# Patient Record
Sex: Male | Born: 1975 | Hispanic: Yes | Marital: Married | Smoking: Never smoker
Health system: Southern US, Community
[De-identification: ages and names within clinical notes are randomized; demographics above are authoritative.]

## PROBLEM LIST (undated history)

## (undated) DIAGNOSIS — E785 Hyperlipidemia, unspecified: Secondary | ICD-10-CM

## (undated) NOTE — ED Triage Notes (Signed)
Formatting of this note might be different from the original.  Pt reports he was drinking last night when he tripped and fell landing on L arm. Significant bruising and swelling noted.   Electronically signed by Wonda Amis, RN at 06/02/2018 11:26 PM EDT

## (undated) NOTE — ED Provider Notes (Signed)
Formatting of this note is different from the original.  26 year old received at sign out from [place] Downing pending [place] and MRI. Per her history:     "74 year old male, visiting from out of town, presents to the emergency department for left arm pain.  Patient was drinking with his uncle last night when he tripped and fell on the stairs.  Patient landed on his left arm.  He has had gradual worsening swelling to the left upper extremity.  This is accompanied by bruising.  He has not taken any medications for symptoms.  The patient was scheduled to return home today, but decided to come to the emergency department but given that his pain was so severe.  Family denies chronic alcohol use.  Pain is aggravated with movement of the left arm as well as palpation.  The patient is not on chronic anticoagulation."    Physical Exam   BP (!) 149/99 (BP Location: Right Leg)   Pulse 63   Temp 99.3 F (37.4 C) (Oral)   Resp 18   SpO2 94%     Physical Exam    NAD.  Ecchymosis noted to the left elbow.    ED Course/Procedures       Procedures    MDM   50 year old male received signout from PA whom is pending ultrasound and MRI for left arm pain after the patient had a fall last night.  Pain controlled in ED.  Ultrasound is negative for DVT of the left upper extremity.  MRI with extensive soft tissue injuries.  the radial and ulnar collateral ligaments are completely torn as well as the common flexor and extensor tendons.  There also associated extensive muscle injuries, including the pronator teres muscle.  Spoke with Dr. Dion Saucier about the MRI results.  No urgent surgical intervention required.  Posterior long-arm splint applied.  He was given a copy of his MRI results.  He is from [place] and plans to follow-up with orthopedics when he returns home.  Discharged with Rx for pain medication. A 85-month prescription history query was performed using the [place] CSRS prior to discharge.  Strict return precautions given, signs and  symptoms of compartment syndrome.  The patient is hemodynamically stable and in no acute distress.  He is safe for discharge home at this time with outpatient follow-up.      Barkley Boards, PA-C  06/03/18 1657      Gilda Crease, MD  06/04/18 [phone]    Electronically signed by Gilda Crease, MD at 06/04/2018  5:58 AM EDT    Associated attestation - Gilda Crease, MD - 06/04/2018  5:58 AM EDT  Formatting of this note might be different from the original.  Attestation: Medical screening examination/treatment/procedure(s) were performed by non-physician practitioner and as supervising physician I was immediately available for consultation/collaboration.    EKG: None

## (undated) NOTE — ED Provider Notes (Signed)
Formatting of this note is different from the original.  Images from the original note were not included.    [name]  Provider Note    CSN: [id number]  Arrival date & time: 06/02/18  2315    History    Chief Complaint  Chief Complaint   Patient presents with   ? Arm Injury     HPI  [name] is a 54 y.o. male.    33 year old male, visiting from out of town, presents to the emergency department for left arm pain.  Patient was drinking with his uncle last night when he tripped and fell on the stairs.  Patient landed on his left arm.  He has had gradual worsening swelling to the left upper extremity.  This is accompanied by bruising.  He has not taken any medications for symptoms.  The patient was scheduled to return home today, but decided to come to the emergency department but given that his pain was so severe.  Family denies chronic alcohol use.  Pain is aggravated with movement of the left arm as well as palpation.  The patient is not on chronic anticoagulation.    History reviewed. No pertinent past medical history.    There are no active problems to display for this patient.    History reviewed. No pertinent surgical history.     Home Medications      Prior to Admission medications    Not on File     Family History  No family history on file.    Social History  Social History     Tobacco Use   ? Smoking status: Not on file   Substance Use Topics   ? Alcohol use: Not on file   ? Drug use: Not on file     Allergies    Patient has no known allergies.    Review of Systems  Review of Systems  Ten systems reviewed and are negative for acute change, except as noted in the HPI.     Physical Exam  Updated Vital Signs  BP 131/90 (BP Location: Right Arm)   Pulse 99   Temp 99.3 F (37.4 C) (Oral)   Resp 18   SpO2 98%     Physical Exam   Constitutional: He is oriented to person, place, and time. He appears well-developed and well-nourished. No distress.   Appears  uncomfortable. Nontoxic.    HENT:   Head: Normocephalic and atraumatic.   Eyes: Conjunctivae and EOM are normal. No scleral icterus.   Neck: Normal range of motion.   Cardiovascular: Normal rate, regular rhythm and intact distal pulses.   Distal radial pulse 2+ in the LUE. Capillary refill brisk in all digits of the L hand.   Pulmonary/Chest: Effort normal. No respiratory distress.   Respirations even and unlabored   Musculoskeletal: Normal range of motion.   Extensive ecchymosis and edema to the medial aspect of the LUE extending from just below the L elbow proximally. Compartments remain soft despite degree of edema. Normal active PROM of the L wrist. Limited flexion and extension of the L elbow 2/2 pain. No bony deformity or crepitus.   Neurological: He is alert and oriented to person, place, and time. He exhibits normal muscle tone. Coordination normal.   Grip strength 4+/5 in the L hand 2/2 discomfort.   Skin: Skin is warm and dry. No rash noted. He is not diaphoretic. No erythema. No pallor.   Psychiatric: He has  a normal mood and affect. His behavior is normal.   Nursing note and vitals reviewed.    ED Treatments / Results   Labs  (all labs ordered are listed, but only abnormal results are displayed)  Labs Reviewed   BASIC METABOLIC PANEL - Abnormal; Notable for the following components:       Result Value    Glucose, Bld 149 (*)     Calcium 8.4 (*)     All other components within normal limits   D-DIMER, QUANTITATIVE (NOT AT [facility]) - Abnormal; Notable for the following components:    D-Dimer, Quant 0.60 (*)     All other components within normal limits   CK - Abnormal; Notable for the following components:    Total CK 1,675 (*)     All other components within normal limits   I-STAT CG4 LACTIC ACID, ED - Abnormal; Notable for the following components:    Lactic Acid, Venous 2.00 (*)     All other components within normal limits   CBC     EKG  None    Radiology  Dg Forearm Left    Result Date:  06/03/2018  CLINICAL DATA:  Patient fell last night. Pain down the left arm. Bruising at the elbow. EXAM: LEFT FOREARM - 2 VIEW COMPARISON:  None. FINDINGS: Ulna minus deformity. Left radius and ulna appear intact. No acute fracture or dislocation. No focal bone lesion or bone destruction. Bone cortex appears intact. Soft tissues are unremarkable. IMPRESSION: No acute bony abnormalities. Electronically Signed   By: [name] M.D.   On: 06/03/2018 00:10     Dg Humerus Left    Result Date: 06/03/2018  CLINICAL DATA:  Pain in the left arm with bruising at the elbow. Fall last night. EXAM: LEFT HUMERUS - 2+ VIEW COMPARISON:  None. FINDINGS: There is no evidence of fracture or other focal bone lesions. Soft tissues are unremarkable. IMPRESSION: Negative. Electronically Signed   By: [name] M.D.   On: 06/03/2018 00:14     Procedures  Procedures (including critical care time)    Medications Ordered in ED  Medications   oxyCODONE-acetaminophen (PERCOCET/ROXICET) 5-325 MG per tablet 2 tablet (2 tablets Oral Given 06/03/18 1914)     4:04 AM  Dr. Dion Saucier is currently in the OR. He will assess the patient in the ED to assist in disposition.    5:31 AM  Dr. Dion Saucier at bedside.    5:46 AM  Plan discussed with Dr. Dion Saucier who advises MRI elbow seeing that patient is from out of town. Also plan for posterior long arm splint and sling after imaging. Given elevated dimer, will also order upper extremity venous duplex.    Initial Impression / Assessment and Plan / ED Course   I have reviewed the triage vital signs and the nursing notes.    Pertinent labs & imaging results that were available during my care of the patient were reviewed by me and considered in my medical decision making (see chart for details).        6 year old male presents to the emergency department for evaluation of left elbow pain.  He reports being intoxicated yesterday and falling on his left arm.  His x-rays are negative for fracture.  There is  significant ecchymosis and edema starting just below the left elbow and extending proximally.  Compartments soft and distal radial pulse intact.    Patient evaluated by Dr. Dion Saucier in the emergency department.  There is no current  concern for compartment syndrome.  Given that injury occurred more than 24 hours ago, the development of this is less likely.  Patient is visiting from [place].  Seeing that the patient is from out of town, Dr. Dion Saucier advises MRI be obtained in the ED prior to discharge.  Will also complete upper extremity duplex.  Following imaging, will place in posterior long-arm splint and sling for mobilization.    Patient signed out to Frederik Pear, PA-C at change of shift pending imaging.  Patient will need all images loaded to a CD at time of discharge should he choose to follow up with an Orthopedist in his hometown.    Final Clinical Impressions(s) / ED Diagnoses     Final diagnoses:   Contusion of left elbow, initial encounter   Fall, initial encounter     ED Discharge Orders     None         Antony Madura, PA-C  06/03/18 1610      Gilda Crease, MD  06/03/18 [phone]    Electronically signed by Gilda Crease, MD at 06/03/2018  6:01 AM EDT    Associated attestation - Gilda Crease, MD - 06/03/2018  6:01 AM EDT  Formatting of this note might be different from the original.  Medical screening examination/treatment/procedure(s) were conducted as a shared visit with non-physician practitioner(s) and myself.  I personally evaluated the patient during the encounter.    None     Patient with recent injury to left arm after a fall, presents with increased pain and swelling. Diffuse edema of bicep and forearm area. Mild traumatic rhabdo. Seen by Dr. Dion Saucier of ortho, no evidence of compartment syndrome, will MRI.

## (undated) NOTE — ED Notes (Signed)
Formatting of this note might be different from the original.  PA Humes informed of lactic acid results 2.00  Electronically signed by Maximino Greenland, EMT at 06/03/2018  2:27 AM EDT

## (undated) NOTE — Progress Notes (Signed)
Formatting of this note might be different from the original.  Orthopedic Tech Progress Note  Patient Details:   [name]  November 25, 1976  [id number]    Ortho Devices  Type of Ortho Device: Ace wrap, Arm sling, Post (long arm) splint  Ortho Device/Splint Location: LUE  Ortho Device/Splint Interventions: Ordered, Application    Post Interventions  Patient Tolerated: Well  Instructions Provided: Care of device    [name]  06/03/2018, 3:55 PM    Electronically signed by Irven Baltimore C at 06/03/2018  3:55 PM EDT

## (undated) NOTE — Unmapped External Note (Signed)
Formatting of this note is different from the original.    ORTHOPAEDIC CONSULTATION    REQUESTING PHYSICIAN: Gilda Crease, *    Chief Complaint: Left elbow pain and swelling    HPI:  [name] is a 39 y.o. male who complains of left elbow pain and swelling for the past 48 hours approximately.  He drank heavily on Saturday night, fell down, immediately got up, but developed significant swelling and pain over the following 24 hours.  He came to the emergency room for evaluation.  He denies any previous injury like this before.  Translation was helped with the help of his family, and also my own [place].  He denies numbness or tingling down the hand.  Movement of the elbow is making it worse, he cannot bend his elbow much actively at all, he denies shoulder pain.      History reviewed. No pertinent past medical history.  History reviewed. No pertinent surgical history.  Social History     Socioeconomic History   ? Marital status: Single     Spouse name: Not on file   ? Number of children: Not on file   ? Years of education: Not on file   ? Highest education level: Not on file   Occupational History   ? Not on file   Social Needs   ? Financial resource strain: Not on file   ? Food insecurity:     Worry: Not on file     Inability: Not on file   ? Transportation needs:     Medical: Not on file     Non-medical: Not on file   Tobacco Use   ? Smoking status: Not on file   Substance and Sexual Activity   ? Alcohol use: Not on file   ? Drug use: Not on file   ? Sexual activity: Not on file   Lifestyle   ? Physical activity:     Days per week: Not on file     Minutes per session: Not on file   ? Stress: Not on file   Relationships   ? Social connections:     Talks on phone: Not on file     Gets together: Not on file     Attends religious service: Not on file     Active member of club or organization: Not on file     Attends meetings of clubs or organizations: Not on file     Relationship status: Not on  file   Other Topics Concern   ? Not on file   Social History Narrative   ? Not on file     No family history on file.  No Known Allergies    Positive ROS: All other systems have been reviewed and were otherwise negative with the exception of those mentioned in the HPI and as above.    Physical Exam:  General: Alert, no acute distress  Cardiovascular: No pedal edema  Respiratory: No cyanosis, no use of accessory musculature  GI: No organomegaly, abdomen is soft and non-tender  Skin: No skin breaks over the elbow, but he has substantial ecchymosis medially.  Neurologic: Sensation intact distally  Psychiatric: Patient is competent for consent with normal mood and affect  Lymphatic: No axillary or cervical lymphadenopathy    MUSCULOSKELETAL: Left elbow has substantial soft tissue swelling, although I do not feel compartment syndrome.  His humerus is nontender proximally.  Shoulder motion is full.  His left elbow has  substantial pain medially, as well as anteriorly, I do not see evidence for infection, he has a clinical appearance of either a biceps rupture or a elbow subluxation/dislocation event.  He will not supinate his forearm.  His triceps strength is intact.    Assessment:  Severe left elbow swelling concerning for biceps tendon rupture versus dislocation relocation event.    Plan:  This is an acute severe injury, and I recommended an MRI to evaluate his distal biceps.  I am highly suspicious this is ruptured.  If this is ruptured, then he would be best to consider surgical intervention.  I am happy seen in the office after the MRI is completed and make a definitive treatment plan.  Alternatively, apparently he lives in [place] if he wants to pursue further medical treatment in [place] than that is appropriate as well.    I would also recommend posterior splinting, and pain medications as needed, I do not see evidence for compartment syndrome, and the time course at this point would indicate that he is not  likely at further risk for compartment syndrome.    Eulas Post, MD  Cell [phone]    06/03/2018  5:41 AM      Electronically signed by Teryl Lucy, MD at 06/03/2018  5:46 AM EDT

## (undated) NOTE — Progress Notes (Signed)
Formatting of this note might be different from the original.  LUE venous duplex prelim: negative for DVT  Farrel Demark, RDMS, RVT   Electronically signed by Farrel Demark I at 06/03/2018  2:45 PM EDT

## (undated) NOTE — ED Notes (Signed)
Formatting of this note might be different from the original.  Pt discharged from ED; instructions provided and scripts given; Pt encouraged to return to ED if symptoms worsen and to f/u with PCP; Pt verbalized understanding of all instructions  Electronically signed by [place], Tray[place]iel, RN at 06/03/2018  3:41 PM EDT

---

## 2002-09-17 ENCOUNTER — Ambulatory Visit (INDEPENDENT_AMBULATORY_CARE_PROVIDER_SITE_OTHER)
Admit: 2002-09-17 | Disposition: A | Discharge status: Home / Self Care | Payer: Self-pay | Source: Ambulatory Visit | Admitting: Emergency Medicine

## 2011-11-30 ENCOUNTER — Emergency Department: Payer: Self-pay

## 2011-11-30 ENCOUNTER — Emergency Department
Admission: EM | Admit: 2011-11-30 | Discharge: 2011-11-30 | Disposition: A | Discharge status: Home / Self Care | Payer: Self-pay | Attending: Emergency Medicine | Admitting: Emergency Medicine

## 2011-11-30 DIAGNOSIS — S139XXA Sprain of joints and ligaments of unspecified parts of neck, initial encounter: Secondary | ICD-10-CM | POA: Insufficient documentation

## 2011-11-30 DIAGNOSIS — S39012A Strain of muscle, fascia and tendon of lower back, initial encounter: Secondary | ICD-10-CM

## 2011-11-30 DIAGNOSIS — IMO0002 Reserved for concepts with insufficient information to code with codable children: Secondary | ICD-10-CM | POA: Insufficient documentation

## 2011-11-30 MED ORDER — CYCLOBENZAPRINE HCL 10 MG PO TABS
5.0000 mg | ORAL_TABLET | Freq: Three times a day (TID) | ORAL | Status: AC | PRN
Start: 2011-11-30 — End: 2011-12-10

## 2011-11-30 MED ORDER — IBUPROFEN 600 MG PO TABS
600.00 mg | ORAL_TABLET | Freq: Four times a day (QID) | ORAL | Status: AC | PRN
Start: 2011-11-30 — End: 2011-12-10

## 2011-11-30 NOTE — Discharge Instructions (Signed)
----------------------------------- Begin Special Instructions ----------------------------------    Dolor de espalda, cervical, no especfico     Back Pain, Cervical NOS     1.  Usted ha sido atendido por dolor en el cuello. Esta zona tambin es conocida como espina cervical.   1.  You have been seen for neck pain. This area is also called the cervical spine.             2.  La espina cervical se encuentra entre la base del crneo y la parte superior de los hombros.   2.  The cervical spine is between the base of the skull and the top of the shoulders.             3.  Existen muchas causas para el dolor de cuello. Algunas de las ms comunes incluyen: dolor de [employer], Buyer, retail, espasmos musculares, dolor a causa de uso excesivo y compresin nerviosa.    3.  There are many different reasons for neck pain. Some of the more common include: Bone pain, muscle strain, muscle spasm, pain from overuse, and pinched nerves.              4.  Las radiografas de su cuello no mostraron huesos rotos.   4.  The x-rays of your neck showed no broken bones.             5.  El mdico an no sabe la causa exacta de su dolor. Parece ser que su dolor no es causado por algo peligroso. Ya se puede ir a Paediatric nurse.   5.  The doctor still does not know the exact cause of your pain. Your problem does not seem to be from a dangerous cause. It is safe for you to go home today.             6.  Algunas cosas que puede tratar de hacer para ayudar a sentirse mejor de su cuello son:   6.  Some things you can try to help your neck feel better are:     Aplique un pao hmedo y tibio sobre el cuello durante 20 minutos, al menos 4 veces al [employer]. Esto reducir Agricultural consultant. Dar un masaje a su cuello tambin podra ayudarle.    * Apply a warm damp washcloth to the neck for 20 minutes at a time, at least 4 times per day. This will reduce your pain. Massaging your neck might also  help.     Pida a alguien Engineer, materials d [employer] partes adoloridas de su cuello.    * Have someone massage the sore parts of your neck.     - Evite levantar objetos pesados o doblar su cuerpo. Puede continuar con sus actividades cotidianas siempre y cuando no Metallurgist.    * Roe Coombs t do any heavy lifting or bending. You can go back to normal daily activities if they don t make the pain worse.     - Utilice un medicamento antiinflamatorio que no requiera receta mdica, Ibuprofeno (tambin conocido como Advil o Motrin) tal y como se le indique en el envase para ayudar a Primary school teacher y la inflamacin.     * - Use an over-the-counter anti-inflammatory medication Ibuprofen (also known as Advil or Motrin) as directed on the package to help with pain and inflammation.              7.  Es normal que Chief Technology Officer dure  varios das. Si el dolor disminuye, posiblemente no necesite visitar al mdico. Sin embargo, si sus sntomas empeoran o tiene nuevos sntomas, debe regresar aqu o ir a la [facility] ms [facility].   7.  It is normal for the pain may last for the next few days. If your pain gets better, you probably do not need to see a doctor. However, if your symptoms get worse or you have new symptoms, you should return here or go to the nearest Emergency Department.             8.  Llame a su mdico o vaya a la [facility] ms cercana si el dolor no disminuye o si su dolor es lo bastante fuerte como para limitar seriamente sus actividades cotidianas.   8.  Call your physician or go to the nearest Emergency Department if you your pain does not improve or your pain is bad enough to seriously limit your normal activities.             9.  DEBE BUSCAR ATENCIN MDICA INMEDIATAMENTE, AQU O EN LA SALA DE EMERGENCIAS MS CERCANA, SI SE PRESENTA CUALQUIERA DE LAS SIGUIENTES SITUACIONES:   9.  YOU SHOULD SEEK MEDICAL ATTENTION IMMEDIATELY, EITHER HERE OR AT THE NEAREST  EMERGENCY DEPARTMENT, IF ANY OF THE FOLLOWING OCCURS:     Cree que el dolor proviene de otro lado diferente de su espalda. Esto puede incluir dolor de pecho. En ocasiones, esto es causado por angina (dolores del corazn) o por otras causas peligrosas.    * You think the pain is coming from somewhere other than your back. This can include chest pain. This is sometimes from angina (heart pains) or other dangerous causes.     - Tiene falta de aire, sudoracin, dolor de pecho (o presin, pesadez, indigestin, etc.).    * - You have shortness of breath, sweating, chest pain (or pressure, heaviness, indigestion, etc).     Siente hormigueo o entumecimiento (prdida de sensacin) en sus brazos y piernas.    * Your arms and legs tingle or get numb (lose feeling).     Sus brazos o piernas estn dbiles.    * Your arms or legs are weak.     Tiene fiebre (temperatura mayor de 100.61F o 38C) junto con dolor de [place] y dolor de cuello.    * You have fevers (temperature of more than 100.61F or 38C) along with headache and neck pain.     Su dolor de cuello empeora.    * Your neck pain is getting worse.     Pierde el control de su vejiga o sus intestinos. Si esto se presenta, puede ocasionar que se moje o se ensucie.     * You lose control of your bladder or bowels. If this were to happen, it may cause you to wet or soil yourself.      Tiene dificultad para orinar.    * You have problems urinating (peeing).           ----------------------------------- Begin Special Instructions ----------------------------------    Lesin en la cabeza, no especfica     Head Injury, NOS     1.  Usted ha sido atendido por una lesin en la cabeza.   1.  You have been seen for a head injury.             2.  Neomia Dear lesin en la cabeza puede presentarse tras un golpe en la cabeza o  como resultado de una cada u otra lesin. Las lesiones en la cabeza Zenaida Niece desde lesiones leves hasta lesiones ms graves. Las  lesiones ms graves Armed forces logistics/support/administrative officer en huesos rotos o lesiones en el cerebro. Las lesiones leves no mostrarn anormalidades si se realiza una tomografa computarizada del cerebro.    2.  A head injury can happen after something strikes the head or as a result of a fall or other injury. Head injuries can range from mild injuries to more severe injuries. The more severe injuries can result in broken bones or injury to the brain itself. Mild head injuries will show no abnormalities if a CT (CAT) scan of the brain is done.              3.  Aunque haya tenido una lesin en su cabeza, no parece tener una lesin cerebral seria.    3.  Although you had an injury to your head, you do not seem to have a serious brain injury.              4.  Los sntomas de una lesin en la cabeza pueden durar desde unas horas hasta unos meses. El tiempo depende de qu tan grave fue la lesin. Tambin depende si usted tuvo una conmocin cerebral en el pasado. Algunos problemas que se presentan con una conmocin pueden incluir: problemas para dormir, de memoria y Librarian, academic. Adems, pueden incluir dolores de cabeza crnicos Chief Executive Officer) y sensibilidad a Statistician. Estos sntomas pueden presentarse inmediatamente despus de la conmocin. Tambin pueden presentarse poco a poco con [employer]. Pueden durar [employer]. Cuando esto sucede, se le llama "sndrome de post-conmocin cerebral".   4.  Head injury symptoms can last from hours to months. The time depends on how bad the injury was. It also depends on whether you ve had a concussion in the past. Some problems with a concussion can include: Sleep, memory and concentration problems. They also include chronic (ongoing) headaches and sensitivity to light. These symptoms can happen soon after the concussion. They can also develop slowly over time. They can last up to a year. When this happens, it is called "post concussion syndrome."             5.  Si desarrolla el  "sndrome de post-conmocin cerebral", debera llevar un seguimiento con su mdico. Su mdico puede atenderlo o mandarlo a un especialista de lesiones en la cabeza.   5.  If you develop "post-concussive syndrome," you should follow up with your doctor. Your doctor can care for you or provide a referral to a head-injury specialist.             6.  El tratamiento incluye vigilancia en la casa y tomar analgsicos como acetaminofeno (Tylenol) o ibuprofeno (Advil o Motrin). Es muy probable que no necesite analgsicos con Emergency planning/management officer.   6.  Treatment includes observation at home and pain medicine like acetaminophen (Tylenol) or ibuprofen (Advil or Motrin). Prescription pain medicine is probably not needed.             7.  Puede tener un dolor de cabeza leve por [employer].   7.  You might have a mild headache for a few days.             8.  En las prximas 24 horas:   8.  Over the next 24 hours:     Permanezca con un familiar o amigos que puedan vigilar su comportamiento.    *  Stay with family or friends who can watch your behavior.     Evite el alcohol o las drogas.    * Avoid alcohol or drugs.             9.  DEBE BUSCAR ATENCIN MDICA INMEDIATAMENTE, AQU O EN LA SALA DE EMERGENCIAS MS CERCANA, SI SE PRESENTA CUALQUIERA DE LAS SIGUIENTES SITUACIONES:   9.  YOU SHOULD SEEK MEDICAL ATTENTION IMMEDIATELY, EITHER HERE OR AT THE NEAREST EMERGENCY DEPARTMENT, IF ANY OF THE FOLLOWING OCCURS:     El dolor de [place].    * Your headache gets worse.     El dolor de [place].    * Your headache pain changes.     Tiene fiebre, dolor de cuello, cambios en la visin, dificultad para caminar o cambio de comportamiento.    * You have a fever, neck pain, vision changes, difficulty walking or change of behavior.     Siente entumecimiento, hormigueo o debilidad en sus brazos o piernas.    * You feel numbness, tingling, weakness in your arms or legs.      Se desmaya.    * You faint.     Su visin cambia.    * Your vision changes.     Tiene vmitos frecuentes o no puede retener medicamento.    * You vomit often or cannot keep medicine down.     Se siente confuso o tiene dificultad para despertarse.    * You are confused or have difficulty waking from sleep.
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
## 2018-06-02 ENCOUNTER — Emergency Department (HOSPITAL_COMMUNITY)
Admission: EM | Admit: 2018-06-02 | Discharge: 2018-06-03 | Disposition: A | Discharge status: Home / Self Care | Payer: Self-pay | Attending: Emergency Medicine | Admitting: Emergency Medicine

## 2018-06-02 ENCOUNTER — Emergency Department (HOSPITAL_COMMUNITY): Payer: Self-pay

## 2018-06-02 ENCOUNTER — Encounter (HOSPITAL_COMMUNITY): Payer: Self-pay | Admitting: Emergency Medicine

## 2018-06-02 ENCOUNTER — Other Ambulatory Visit: Payer: Self-pay

## 2018-06-02 DIAGNOSIS — Y9389 Activity, other specified: Secondary | ICD-10-CM | POA: Insufficient documentation

## 2018-06-02 DIAGNOSIS — S5322XA Traumatic rupture of left radial collateral ligament, initial encounter: Secondary | ICD-10-CM | POA: Insufficient documentation

## 2018-06-02 DIAGNOSIS — S5332XA Traumatic rupture of left ulnar collateral ligament, initial encounter: Secondary | ICD-10-CM | POA: Insufficient documentation

## 2018-06-02 DIAGNOSIS — W19XXXA Unspecified fall, initial encounter: Secondary | ICD-10-CM

## 2018-06-02 DIAGNOSIS — M79602 Pain in left arm: Secondary | ICD-10-CM | POA: Insufficient documentation

## 2018-06-02 DIAGNOSIS — S5002XA Contusion of left elbow, initial encounter: Secondary | ICD-10-CM | POA: Insufficient documentation

## 2018-06-02 DIAGNOSIS — W109XXA Fall (on) (from) unspecified stairs and steps, initial encounter: Secondary | ICD-10-CM | POA: Insufficient documentation

## 2018-06-02 DIAGNOSIS — Y929 Unspecified place or not applicable: Secondary | ICD-10-CM | POA: Insufficient documentation

## 2018-06-02 DIAGNOSIS — Y999 Unspecified external cause status: Secondary | ICD-10-CM | POA: Insufficient documentation
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
## 2018-06-03 ENCOUNTER — Emergency Department (HOSPITAL_COMMUNITY): Payer: Self-pay

## 2018-06-03 ENCOUNTER — Emergency Department (HOSPITAL_BASED_OUTPATIENT_CLINIC_OR_DEPARTMENT_OTHER)
Admit: 2018-06-03 | Discharge: 2018-06-03 | Disposition: A | Discharge status: Home / Self Care | Payer: Self-pay | Attending: Emergency Medicine | Admitting: Emergency Medicine

## 2018-06-03 DIAGNOSIS — M79609 Pain in unspecified limb: Secondary | ICD-10-CM

## 2018-06-03 LAB — BASIC METABOLIC PANEL
Anion gap: 12 (ref 5–15)
BUN: 11 mg/dL (ref 6–20)
CALCIUM: 8.4 mg/dL — AB (ref 8.9–10.3)
CO2: 23 mmol/L (ref 22–32)
Chloride: 109 mmol/L (ref 101–111)
Creatinine, Ser: 1.11 mg/dL (ref 0.61–1.24)
GFR calc Af Amer: >60 mL/min (ref >60)
GLUCOSE: 149 mg/dL — AB (ref 65–99)
Potassium: 3.7 mmol/L (ref 3.5–5.1)
Sodium: 144 mmol/L (ref 135–145)

## 2018-06-03 LAB — CBC
HCT: 45.2 % (ref 39.0–52.0)
Hemoglobin: 14.8 g/dL (ref 13.0–17.0)
MCH: 31.2 pg (ref 26.0–34.0)
MCHC: 32.7 g/dL (ref 30.0–36.0)
MCV: 95.2 fL (ref 78.0–100.0)
PLATELETS: 204 10*3/uL (ref 150–400)
RBC: 4.75 MIL/uL (ref 4.22–5.81)
RDW: 12.2 % (ref 11.5–15.5)
WBC: 8.3 10*3/uL (ref 4.0–10.5)

## 2018-06-03 LAB — I-STAT CG4 LACTIC ACID, ED: Lactic Acid, Venous: 2 mmol/L (ref 0.5–1.9)

## 2018-06-03 LAB — CK: Total CK: 1675 U/L — ABNORMAL HIGH (ref 49–397)

## 2018-06-03 LAB — D-DIMER, QUANTITATIVE: D-Dimer, Quant: 0.6 ug/mL-FEU — ABNORMAL HIGH (ref 0.00–0.50)

## 2018-06-03 MED ORDER — SODIUM CHLORIDE 0.9 % IV BOLUS
1000.0000 mL | Freq: Once | INTRAVENOUS | Status: AC
  Administered 2018-06-03: 1000 mL via INTRAVENOUS

## 2018-06-03 MED ORDER — OXYCODONE-ACETAMINOPHEN 5-325 MG PO TABS: 1.0000 | ORAL_TABLET | Freq: Four times a day (QID) | ORAL | 0 refills | Status: AC | PRN

## 2018-06-03 MED ORDER — HYDROMORPHONE HCL 2 MG/ML IJ SOLN
1.0000 mg | Freq: Once | INTRAMUSCULAR | Status: AC
Start: 2018-06-03 — End: 2018-06-03
  Administered 2018-06-03: 1 mg via INTRAVENOUS
  Filled 2018-06-03: qty 1

## 2018-06-03 MED ORDER — OXYCODONE-ACETAMINOPHEN 5-325 MG PO TABS
2.0000 | ORAL_TABLET | Freq: Once | ORAL | Status: AC
  Administered 2018-06-03: 2 via ORAL
  Filled 2018-06-03: qty 2
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
## 2018-06-03 NOTE — ED Notes (Signed)
Pt discharged from ED; instructions provided and scripts given; Pt encouraged to return to ED if symptoms worsen and to f/u with PCP; Pt verbalized understanding of all instructions 

## 2018-06-03 NOTE — ED Notes (Signed)
PA Humes informed of lactic acid results 2.00

## 2018-06-03 NOTE — Progress Notes (Signed)
LUE venous duplex prelim: negative for DVT. Jonasia Coiner Eunice, RDMS, RVT  

## 2018-06-03 NOTE — ED Provider Notes (Signed)
42 year old received at sign out from [place] pending US and MRI. Per her history:   "42 year old male, visiting from out of town, presents to the emergency department for left arm pain.  Patient was drinking with his uncle last night when he tripped and fell on the stairs.  Patient landed on his left arm.  He has had gradual worsening swelling to the left upper extremity.  This is accompanied by bruising.  He has not taken any medications for symptoms.  The patient was scheduled to return home today, but decided to come to the emergency department but given that his pain was so severe.  Family denies chronic alcohol use.  Pain is aggravated with movement of the left arm as well as palpation.  The patient is not on chronic anticoagulation."  Physical Exam  BP (!) 149/99 (BP Location: Right Leg)   Pulse 63   Temp 99.3 F (37.4 C) (Oral)   Resp 18   SpO2 94%   Physical Exam  NAD.  Ecchymosis noted to the left elbow.  ED Course/Procedures     Procedures  MDM  42 year old male received signout from PA whom is pending ultrasound and MRI for left arm pain after the patient had a fall last night.  Pain controlled in ED.  Ultrasound is negative for DVT of the left upper extremity.  MRI with extensive soft tissue injuries.  the radial and ulnar collateral ligaments are completely torn as well as the common flexor and extensor tendons.  There also associated extensive muscle injuries, including the pronator teres muscle.  Spoke with Dr. Dion SaucierLandau about the MRI results.  No urgent surgical intervention required.  Posterior long-arm splint applied.  He was given a copy of his MRI results.  [place] from [place] and plans to follow-up with orthopedics when he returns home.  Discharged with Rx for pain medication. A 10350-month prescription history query was performed using the [place] CSRS prior to discharge.  Strict return precautions given, signs and symptoms of compartment syndrome.  The patient is  hemodynamically stable and in no acute distress.  He is safe for discharge home at this time with outpatient follow-up.    Frederik PearMcDonald, Norena Bratton A, PA-C 06/03/18 1657    Gilda CreasePollina, Christopher J, MD 06/04/18 [phone]

## 2018-06-03 NOTE — Progress Notes (Signed)
Orthopedic Tech Progress Note Patient Details:  Andrew Lang Aug 02, 1976 [id number]  Ortho Devices Type of Ortho Device: Ace wrap, Arm sling, Post (long arm) splint Ortho Device/Splint Location: LUE Ortho Device/Splint Interventions: Ordered, Application   Post Interventions Patient Tolerated: Well Instructions Provided: Care of device   [name], Andrew Lang 06/03/2018, 3:55 PM

## 2018-06-03 NOTE — Consult Note (Signed)
ORTHOPAEDIC CONSULTATION  REQUESTING PHYSICIAN: Gilda Crease, *  Chief Complaint: Left elbow pain and swelling  HPI: Andrew Lang is a 42 y.o. male who complains of left elbow pain and swelling for the past 48 hours approximately.  He drank heavily on Saturday night, fell down, immediately got up, but developed significant swelling and pain over the following 24 hours.  He came to the emergency room for evaluation.  He denies any previous injury like this before.  Translation was helped with the help of his family, and also my own [place].  He denies numbness or tingling down the hand.  Movement of the elbow is making it worse, he cannot bend his elbow much actively at all, he denies shoulder pain.    History reviewed. No pertinent past medical history. History reviewed. No pertinent surgical history. Social History   Socioeconomic History  . Marital status: Single    Spouse name: Not on file  . Number of children: Not on file  . Years of education: Not on file  . Highest education level: Not on file  Occupational History  . Not on file  Social Needs  . Financial resource strain: Not on file  . Food insecurity:    Worry: Not on file    Inability: Not on file  . Transportation needs:    Medical: Not on file    Non-medical: Not on file  Tobacco Use  . Smoking status: Not on file  Substance and Sexual Activity  . Alcohol use: Not on file  . Drug use: Not on file  . Sexual activity: Not on file  Lifestyle  . Physical activity:    Days per week: Not on file    Minutes per session: Not on file  . Stress: Not on file  Relationships  . Social connections:    Talks on phone: Not on file    Gets together: Not on file    Attends religious service: Not on file    Active member of club or organization: Not on file    Attends meetings of clubs or organizations: Not on file    Relationship status: Not on file  Other Topics Concern  . Not on file  Social  History Narrative  . Not on file   No family history on file. No Known Allergies   Positive ROS: All other systems have been reviewed and were otherwise negative with the exception of those mentioned in the HPI and as above.  Physical Exam: General: Alert, no acute distress Cardiovascular: No pedal edema Respiratory: No cyanosis, no use of accessory musculature GI: No organomegaly, abdomen is soft and non-tender Skin: No skin breaks over the elbow, but he has substantial ecchymosis medially. Neurologic: Sensation intact distally Psychiatric: Patient is competent for consent with normal mood and affect Lymphatic: No axillary or cervical lymphadenopathy  MUSCULOSKELETAL: Left elbow has substantial soft tissue swelling, although I do not feel compartment syndrome.  His humerus is nontender proximally.  Shoulder motion is full.  His left elbow has substantial pain medially, as well as anteriorly, I do not see evidence for infection, he has a clinical appearance of either a biceps rupture or a elbow subluxation/dislocation event.  He will not supinate his forearm.  His triceps strength is intact.  Assessment: Severe left elbow swelling concerning for biceps tendon rupture versus dislocation relocation event.  Plan: This is an acute severe injury, and I recommended an MRI to evaluate his distal biceps.  I am highly  suspicious this is ruptured.  If this is ruptured, then he would be best to consider surgical intervention.  I am happy seen in the office after the MRI is completed and make a definitive treatment plan.  Alternatively, apparently he lives in [place] if he wants to pursue further medical treatment in [place] than that is appropriate as well.  I would also recommend posterior splinting, and pain medications as needed, I do not see evidence for compartment syndrome, and the time course at this point would indicate that he is not likely at further risk for compartment  syndrome.    Eulas PostJoshua P Donise Woodle, MD Cell [phone]   06/03/2018 5:41 AM

## 2018-06-03 NOTE — Discharge Instructions (Addendum)
Recomendamos un seguimiento cercano con ortopedia. Usted ha sido colocado hoy en una frula para la movilizacin y la estabilidad. No retire esta frula a menos que se lo recomiende un ortopedista. Algunos ortopedistas en tu ciudad natal: [facility] [place] [id number], [address], [place], [place] [id number] [phone] NationalOrtopedia familiar y [facility] [place] [facility] [address] [place], [place] [id number] [phone] Mantenga el brazo elevado. Tome 1 tableta de Percocet cada 6 horas segn sea necesario para el dolor intenso. Puede tomar 600 mg de ibuprofeno con alimentos o 650 mg de Tylenol cada 6 horas para el dolor leve a moderado. No trabaje ni conduzca mientras est tomando [place] medicamento, ya que puede causar problemas de [place]. Regrese a la sala de emergencias si los sntomas empeoran o si presenta sntomas como: Si tiene [place] cada o lesin, debilidad, entumecimiento, si la extremidad se siente fra, si no puede sentir su pulso o si desarrolla otros sntomas nuevos. . Coloque hielo en el brazo o use compresas fras de 3 a 4 veces al da durante 5 minutos.  We recommend close follow-up with orthopedics. You have been placed in a splint today for mobilization and stability.  Do not remove this splint unless advised to do so by an orthopedist.  Some orthopaedists in your hometown:   [facility] [place] [id number], [address], [place], [place] [id number] [phone]    [facility] [place] [facility] [address] [place], [place] [id number]  [phone]  Keep the arm elevated.  Take 1 tablet of Percocet every 6 hours as needed for severe pain.  You can take 600 mg of ibuprofen with food or 650 mg of Tylenol every 6 hours for mild to moderate pain.  Do not work or drive while taking this medication because it can cause  you to be impaired.  Return to the ER if the symptoms get worse or if you develop symptoms such as: If you have any fall or injury, weakness, numbness, if the extremity feels cold, if you cannot feel your pulse, or if you develop other new concerning symptoms. Ice the arm or use cold compresses 3-4 times a day for 5 minutes.

## 2019-10-16 IMAGING — MR MR ELBOW*L* W/O CM
4 of 6 series · 19 of 40 positions shown · non-contrast
Comparison: Radiograph 06/02/2018

CLINICAL DATA: Traumatic injury to the left elbow. Pain and
swelling.

EXAM:
MRI OF THE LEFT ELBOW WITHOUT CONTRAST
TECHNIQUE: Multiplanar, multisequence MR imaging of the elbow was performed. No
intravenous contrast was administered.

[Series 9: T1 · axial · 5.0mm · 0.31mm/px · z∈[-58,+61]mm · 3 of 32 slices shown]
[im 6/32]
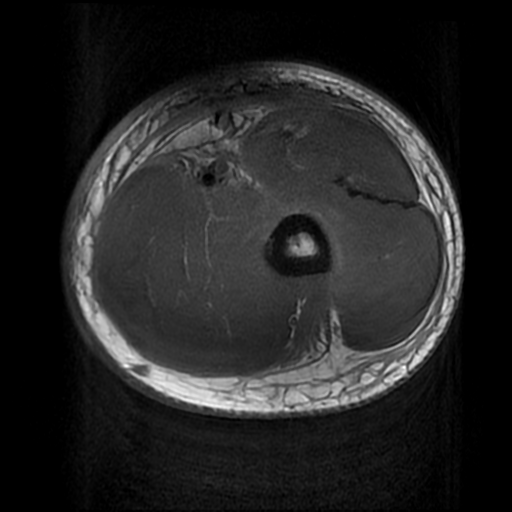
[im 16/32]
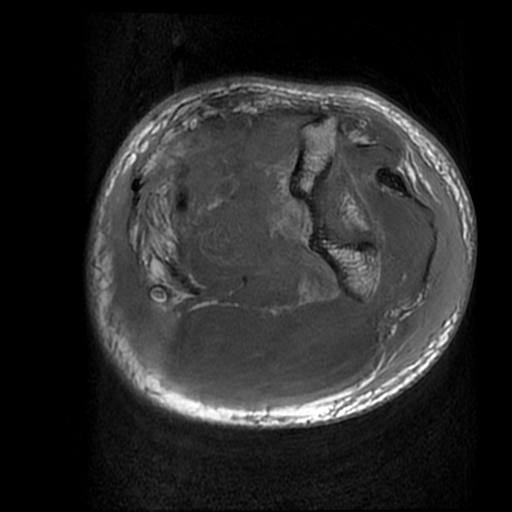
[im 26/32]
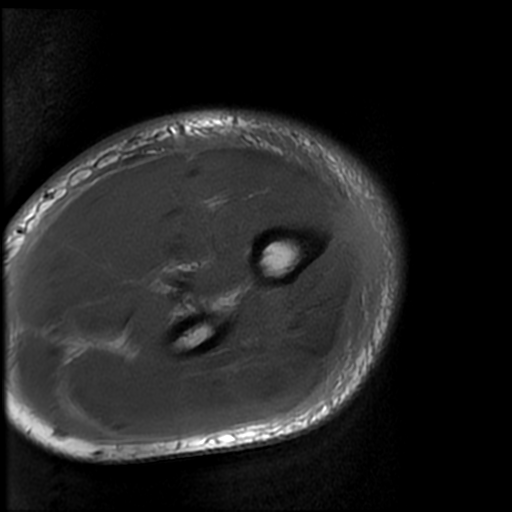

[Series 10: T2 fat-sat · axial · 5.0mm · 0.31mm/px · z∈[-87,+96]mm · 7 of 32 slices shown (1 of 2)]
[im 1/32]
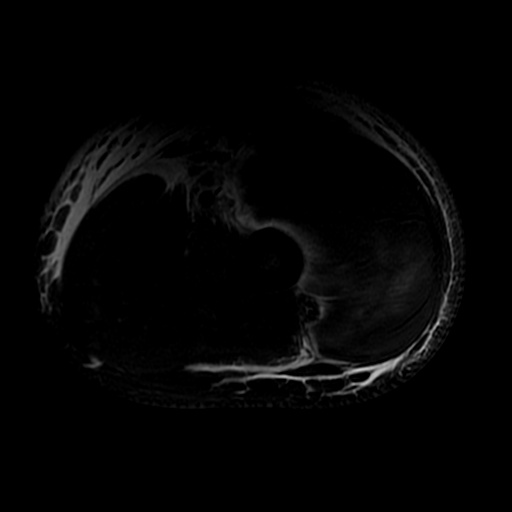
[im 6/32]
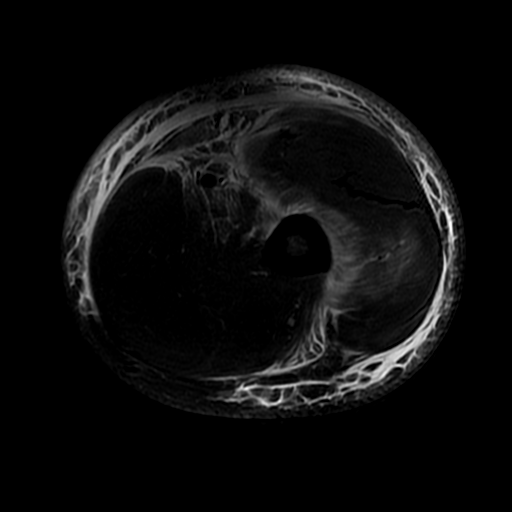
[im 11/32]
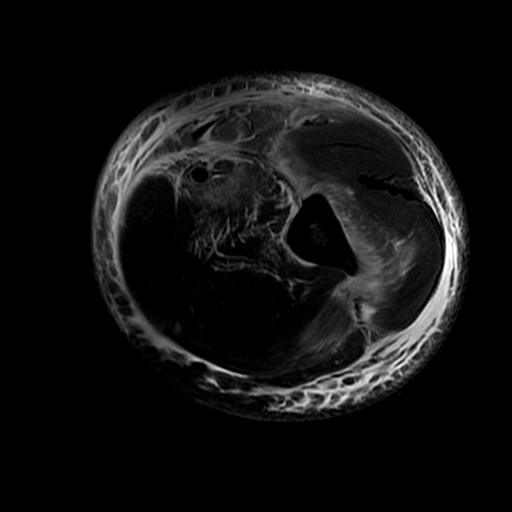
[im 16/32]
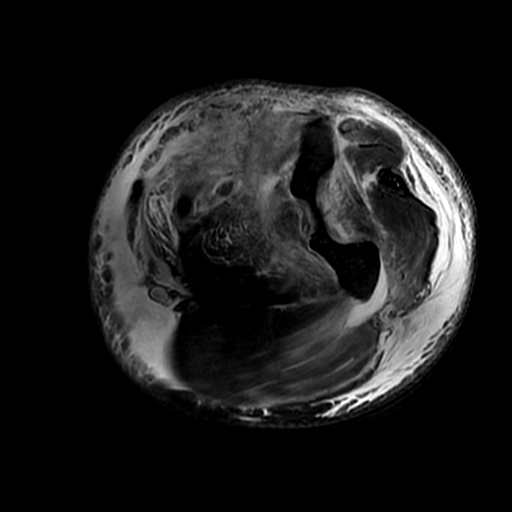
[im 21/32]
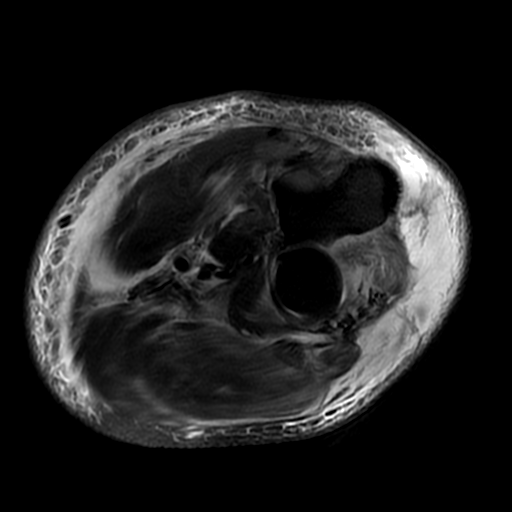
[im 26/32]
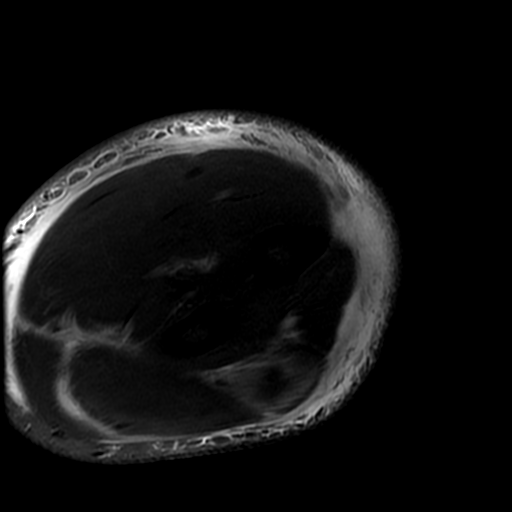
[im 32/32]
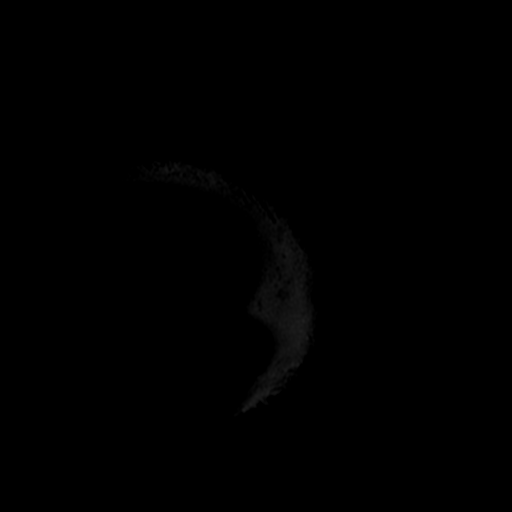

[Series 11: T2 fat-sat · sagittal · 4.0mm · 0.35mm/px · 3 of 29 slices shown (2 of 2)]
[im 5/29]
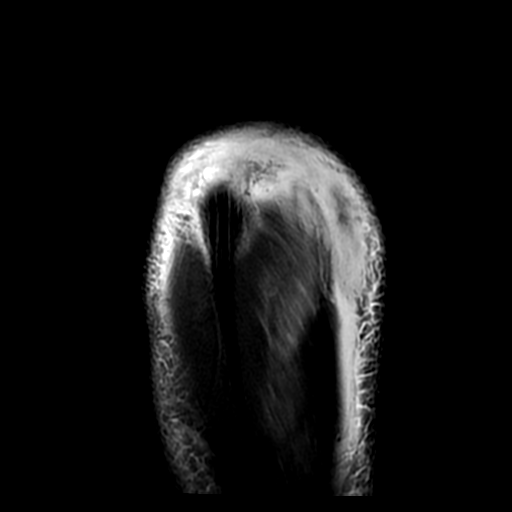
[im 15/29]
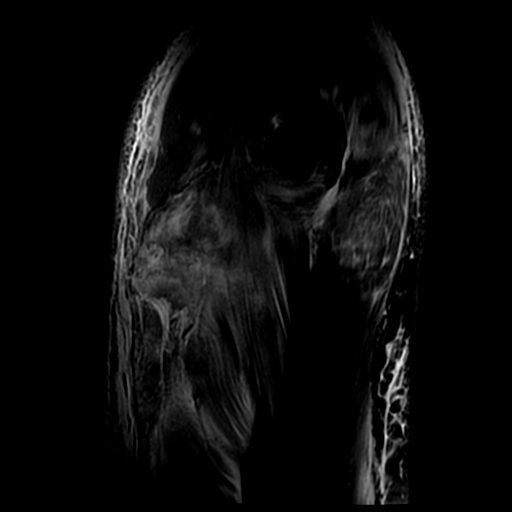
[im 24/29]
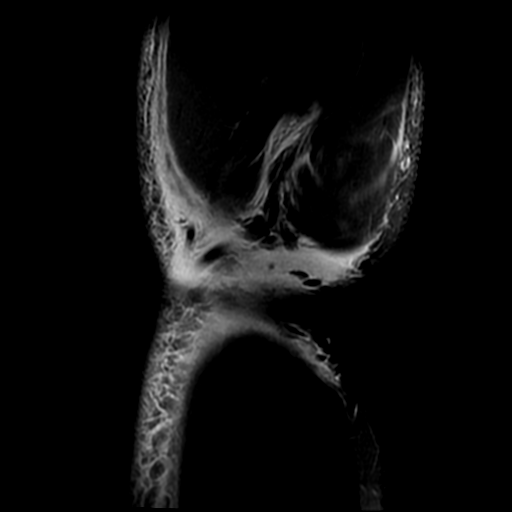

[Series 13: PD fat-sat · coronal · 4.0mm · 0.39mm/px · 6 of 25 slices shown]
[im 1/25]
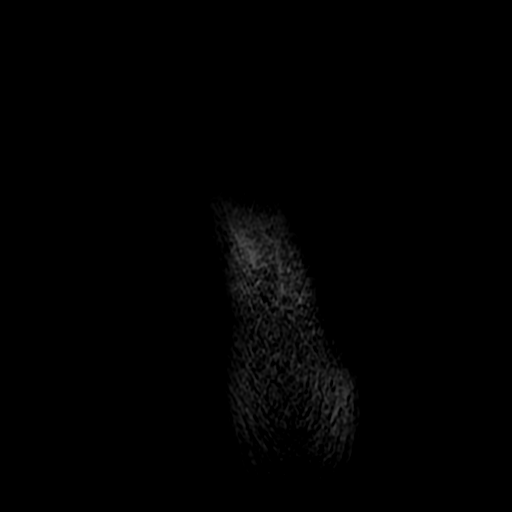
[im 5/25]
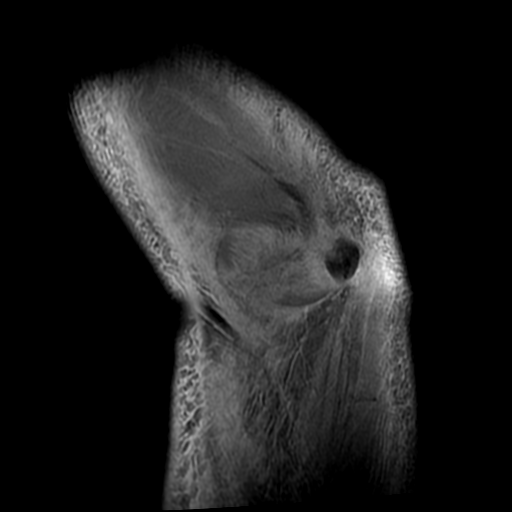
[im 10/25]
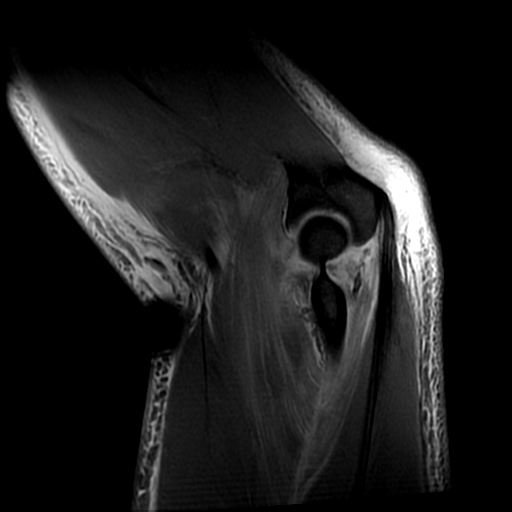
[im 15/25]
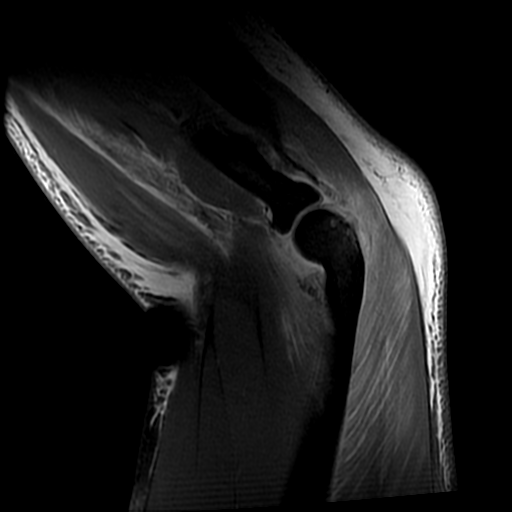
[im 20/25]
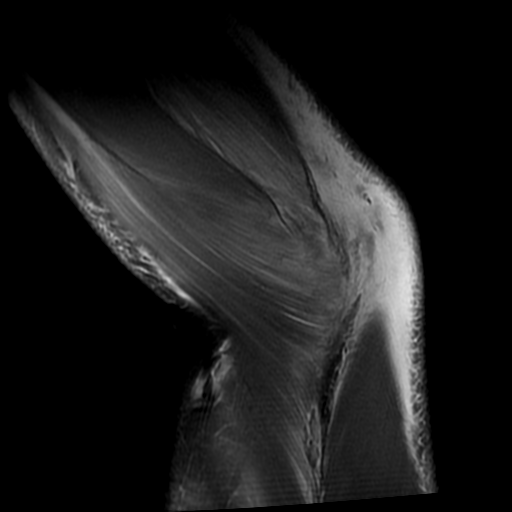
[im 25/25]
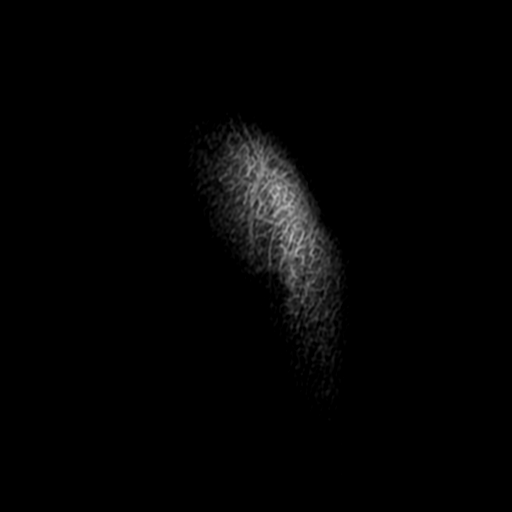

[19 of 40 positions shown; findings below may reference images not displayed]

FINDINGS: Extensive soft tissue injuries involving the elbow region. The bony
structures are intact. No acute fracture, bone contusions or
osteochondral lesions are identified.

There is a small elbow joint effusion and mild synovitis.

The biceps, brachialis and triceps tendons are intact.

The common flexor and extensor tendons are ruptured and the radial
and ulnar collateral ligaments are ruptured.

Extensive muscle injuries surrounding the elbow with areas of marked
edema and surrounding myofascial fluid. In particular, the pronator
teres muscle is severely torn and I suspect the tendon is torn from
its attachment site.

Diffuse subcutaneous soft tissue swelling/edema/fluid is noted.
IMPRESSION: 1. Extensive soft tissue injuries involving the elbow region. The
radial and ulnar collateral ligaments are completely torn as are the
common flexor and extensor tendons. Associated extensive muscle
injuries, most notably the pronator teres muscle.
2. No acute bony findings or osteochondral abnormality.

## 2023-04-09 ENCOUNTER — Emergency Department: Payer: Self-pay

## 2023-04-09 ENCOUNTER — Inpatient Hospital Stay
Admission: EM | Admit: 2023-04-09 | Discharge: 2023-04-12 | DRG: 392 | Disposition: A | Discharge status: Home / Self Care | Payer: Self-pay | Attending: Internal Medicine | Admitting: Internal Medicine

## 2023-04-09 DIAGNOSIS — Z6832 Body mass index (BMI) 32.0-32.9, adult: Secondary | ICD-10-CM

## 2023-04-09 DIAGNOSIS — E1165 Type 2 diabetes mellitus with hyperglycemia: Secondary | ICD-10-CM | POA: Diagnosis present

## 2023-04-09 DIAGNOSIS — E669 Obesity, unspecified: Secondary | ICD-10-CM | POA: Diagnosis present

## 2023-04-09 DIAGNOSIS — I160 Hypertensive urgency: Secondary | ICD-10-CM | POA: Diagnosis present

## 2023-04-09 DIAGNOSIS — K760 Fatty (change of) liver, not elsewhere classified: Secondary | ICD-10-CM | POA: Diagnosis present

## 2023-04-09 DIAGNOSIS — K2900 Acute gastritis without bleeding: Principal | ICD-10-CM | POA: Diagnosis present

## 2023-04-09 DIAGNOSIS — R1013 Epigastric pain: Secondary | ICD-10-CM

## 2023-04-09 DIAGNOSIS — Z833 Family history of diabetes mellitus: Secondary | ICD-10-CM

## 2023-04-09 DIAGNOSIS — E876 Hypokalemia: Secondary | ICD-10-CM | POA: Diagnosis present

## 2023-04-09 DIAGNOSIS — I10 Essential (primary) hypertension: Secondary | ICD-10-CM

## 2023-04-09 HISTORY — DX: Hyperlipidemia, unspecified: E78.5

## 2023-04-09 LAB — CBC AND DIFFERENTIAL
Absolute NRBC: 0 10*3/uL (ref 0.00–0.00)
Basophils Absolute Automated: 0.07 10*3/uL (ref 0.00–0.08)
Basophils Automated: 0.8 %
Eosinophils Absolute Automated: 0.17 10*3/uL (ref 0.00–0.44)
Eosinophils Automated: 2 %
Hematocrit: 49.2 % (ref 37.6–49.6)
Hgb: 18 g/dL — ABNORMAL HIGH (ref 12.5–17.1)
Immature Granulocytes Absolute: 0.01 10*3/uL (ref 0.00–0.07)
Immature Granulocytes: 0.1 %
Instrument Absolute Neutrophil Count: 5.4 10*3/uL (ref 1.10–6.33)
Lymphocytes Absolute Automated: 2.21 10*3/uL (ref 0.42–3.22)
Lymphocytes Automated: 26.1 %
MCH: 30.9 pg (ref 25.1–33.5)
MCHC: 36.6 g/dL — ABNORMAL HIGH (ref 31.5–35.8)
MCV: 84.4 fL (ref 78.0–96.0)
MPV: 12.6 fL — ABNORMAL HIGH (ref 8.9–12.5)
Monocytes Absolute Automated: 0.62 10*3/uL (ref 0.21–0.85)
Monocytes: 7.3 %
Neutrophils Absolute: 5.4 10*3/uL (ref 1.10–6.33)
Neutrophils: 63.7 %
Nucleated RBC: 0 /100 WBC (ref 0.0–0.0)
Platelets: 229 10*3/uL (ref 142–346)
RBC: 5.83 10*6/uL (ref 4.20–5.90)
RDW: 12 % (ref 11–15)
WBC: 8.48 10*3/uL (ref 3.10–9.50)

## 2023-04-09 LAB — COMPREHENSIVE METABOLIC PANEL
ALT: 124 U/L — ABNORMAL HIGH (ref 0–55)
AST (SGOT): 46 U/L — ABNORMAL HIGH (ref 5–41)
Albumin/Globulin Ratio: 1.1 (ref 0.9–2.2)
Albumin: 4.3 g/dL (ref 3.5–5.0)
Alkaline Phosphatase: 114 U/L (ref 37–117)
Anion Gap: 11 (ref 5.0–15.0)
BUN: 15 mg/dL (ref 9.0–28.0)
Bilirubin, Total: 1.1 mg/dL (ref 0.2–1.2)
CO2: 25 mEq/L (ref 17–29)
Calcium: 9.1 mg/dL (ref 8.5–10.5)
Chloride: 100 mEq/L (ref 99–111)
Creatinine: 1 mg/dL (ref 0.5–1.5)
Globulin: 4 g/dL — ABNORMAL HIGH (ref 2.0–3.6)
Glucose: 233 mg/dL — ABNORMAL HIGH (ref 70–100)
Potassium: 3.3 mEq/L — ABNORMAL LOW (ref 3.5–5.3)
Protein, Total: 8.3 g/dL (ref 6.0–8.3)
Sodium: 136 mEq/L (ref 135–145)
eGFR: >60 mL/min/{1.73_m2} (ref >=60)

## 2023-04-09 LAB — LIPASE: Lipase: 28 U/L (ref 8–78)

## 2023-04-09 MED ORDER — FAMOTIDINE 10 MG/ML IV SOLN (WRAP)
20.0000 mg | Freq: Once | INTRAVENOUS | Status: AC
Start: 2023-04-09 — End: 2023-04-09
  Administered 2023-04-09: 20 mg via INTRAVENOUS
  Filled 2023-04-09: qty 2

## 2023-04-09 MED ORDER — SODIUM CHLORIDE 0.9 % IV BOLUS
1000.0000 mL | Freq: Once | INTRAVENOUS | Status: AC
Start: 2023-04-09 — End: 2023-04-10
  Administered 2023-04-09: 1000 mL via INTRAVENOUS

## 2023-04-09 MED ORDER — ONDANSETRON HCL 4 MG/2ML IJ SOLN
4.0000 mg | Freq: Once | INTRAMUSCULAR | Status: AC
Start: 2023-04-09 — End: 2023-04-09
  Administered 2023-04-09: 4 mg via INTRAVENOUS
  Filled 2023-04-09: qty 2

## 2023-04-09 NOTE — ED Provider Notes (Signed)
[place] [facility] EMERGENCY DEPARTMENT H&P         CLINICAL SUMMARY          Diagnosis:    .     Final diagnoses:   None                 Disposition:      ED Disposition       None                         CLINICAL INFORMATION        HPI:      Chief Complaint: Epigastric pain  .    Mark Mark [name] is Mark [name] 47 y.o. male who presents with progressive worsening abdominal pain.  Patient with 2 to 3 days of pain.  Progressive worsening mid abdomen using over-the-counter medicines with some initial relief now nothing improving.  Nausea no vomiting no diarrhea no history of ulcers pancreas problems no history of abdominal surgery does not regular drink alcohol    History obtained from: Patient      ROS:      Positive and negative ROS elements as per HPI.   All Other Systems Reviewed and Negative: y        Physical Exam:      Pulse (!) 101  BP (!) 201/144  Resp 22  SpO2 97 %  Temp 98.3 F (36.8 C)    Physical Exam   Nursing note and vitals reviewed.   Constitutional: . Pt appears well-developed and well-nourished.  Eyes: Conj normal . No icterus  ENT: nl phonation no ear [place]  Cardiovascular: Normal rate, regular rhythm and normal heart sounds.   Pulmonary/Chest: Effort normal  No respiratory distress. breath sounds normal.  GI: Soft. There is no guarding. There is diffuse abdominal tenderness mid abdomen  Musculoskeletal:  no deformity. FROM  Neurological: Mark [name] GCS eye subscore is 4. GCS verbal subscore is 5. GCS motor subscore is 6. MAE   Skin: Skin is warm and dry.   Psychiatric: Pt has Mark [name]  affect. Pt behavior is normal.               PAST HISTORY        Primary Care Provider: No primary care provider on file.        PMH/PSH:    .     History reviewed. No pertinent past medical history.    He has no past surgical history on file.      Social/Family History:      He reports that he has never smoked. He has never used smokeless tobacco. He reports that he does not currently use alcohol. He reports that he does  not use drugs.    History reviewed. No pertinent family history.      Listed Medications on Arrival:    .     Previous Medications    No medications on file      Allergies: He has No Known Allergies.            VISIT INFORMATION                    Medications Given in the ED:    .     ED Medication Orders (From admission, onward)      Start Ordered     Status Ordering Provider    04/09/23 2149 04/09/23 2148  ondansetron (ZOFRAN) injection 4 mg  Once  Route: Intravenous  Ordered Dose: 4 mg       Last MAR action: Given ASLANI-AMOLI, [facility]    04/09/23 2149 04/09/23 2148  sodium chloride 0.9 % bolus 1,000 mL  Once        Route: Intravenous  Ordered Dose: 1,000 mL       Last MAR action: New Bag ASLANI-AMOLI, [facility]    04/09/23 2149 04/09/23 2148  famotidine (PEPCID) injection 20 mg  Once        Route: Intravenous  Ordered Dose: 20 mg       Last MAR action: Given ASLANI-AMOLI, [facility]              Procedures:            Interpretations:      MDM:     Medical Decision Making  Amount and/or Complexity of Data Reviewed  Radiology: ordered.        Pulse Ox Analysis interpreted by me is  97 %% on RA nl without need for supplementation       Labs Independent review and Interpretation by me at time of visit      Radiologic study Independent review and Interpretation by me at time of visit: No acute abnormalities ***    DDX: Gastritis, appendicitis, obstruction, diverticulitis    Clinical Course in the ED:                 Discharge Prescriptions    None                 *This note was generated by the Epic EMR system/ Dragon speech recognition and may contain inherent errors or omissions not intended by the user. Grammatical errors, random word insertions, deletions, pronoun errors and incomplete sentences are occasional consequences of this technology due to software limitations. Not all errors are caught or corrected. If there are questions or concerns about the content of this note or information contained within the  body of this dictation they should be addressed directly with the author for clarification.*            RESULTS        Lab Results:      Results       Procedure Component Value Units Date/Time    CBC and differential [[id number]] Collected: 04/09/23 2154    Specimen: Blood Updated: 04/09/23 2229    Comprehensive metabolic panel [[id number]] Collected: 04/09/23 2154    Specimen: Blood Updated: 04/09/23 2229    Lipase [[id number]] Collected: 04/09/23 2154    Specimen: Blood Updated: 04/09/23 2229                Radiology Results:      CT Abd/Pelvis with IV Contrast only    (Results Pending)               Scribe Attestation:      I was acting as Mark [name] Neurosurgeon for Carmon Sails, MD on [facility] Mark [name],Mark Mark [name]     I am the first provider for this patient and I personally performed the services documented.  is scribing for me on Mark Mark [name],Mark Mark [name]. This note accurately reflects work and decisions made by me.  Carmon Sails, MD

## 2023-04-09 NOTE — ED Triage Notes (Signed)
Patient to ED c/o abdominal pain    Patient reports that abdominal pain w/ nausea occurred since Saturday.    Patient currently denies  recent travel/sick contacts, fever/chills, HA, light headed/dizzy, vision changes, CP/SOB, numbness/tingling in extremities, v/d,  urinary/stool issues, balance issues, or injury.     Patient A&Ox4, GCS 15, respirations regular and pt able to speak in full clear sentences, skin warm and dry.

## 2023-04-10 ENCOUNTER — Emergency Department: Payer: Self-pay

## 2023-04-10 DIAGNOSIS — R079 Chest pain, unspecified: Secondary | ICD-10-CM

## 2023-04-10 DIAGNOSIS — K2900 Acute gastritis without bleeding: Principal | ICD-10-CM | POA: Diagnosis present

## 2023-04-10 DIAGNOSIS — I160 Hypertensive urgency: Secondary | ICD-10-CM

## 2023-04-10 LAB — ECG 12-LEAD
Atrial Rate: 66 {beats}/min
IHS MUSE NARRATIVE AND IMPRESSION: NORMAL
P Axis: 17 degrees
P-R Interval: 160 ms
Q-T Interval: 442 ms
QRS Duration: 84 ms
QTC Calculation (Bezet): 463 ms
R Axis: 2 degrees
T Axis: -26 degrees
Ventricular Rate: 66 {beats}/min

## 2023-04-10 LAB — URINALYSIS WITH REFLEX TO MICROSCOPIC EXAM - REFLEX TO CULTURE
Bilirubin, UA: NEGATIVE
Blood, UA: NEGATIVE
Ketones UA: NEGATIVE
Leukocyte Esterase, UA: NEGATIVE
Nitrite, UA: NEGATIVE
Specific Gravity UA: 1.044 — AB (ref 1.001–1.035)
Urine pH: 7.5 (ref 5.0–8.0)
Urobilinogen, UA: NORMAL mg/dL

## 2023-04-10 LAB — HEMOGLOBIN A1C
Average Estimated Glucose: 226 mg/dL
Hemoglobin A1C: 9.5 % — ABNORMAL HIGH (ref 4.6–5.6)

## 2023-04-10 LAB — HIGH SENSITIVITY TROPONIN-I: hs Troponin-I: 17.6 ng/L

## 2023-04-10 MED ORDER — LABETALOL HCL 5 MG/ML IV SOLN (WRAP)
20.0000 mg | Freq: Once | INTRAVENOUS | Status: AC
Start: 2023-04-10 — End: 2023-04-10
  Administered 2023-04-10: 20 mg via INTRAVENOUS
  Filled 2023-04-10: qty 4

## 2023-04-10 MED ORDER — ACETAMINOPHEN 325 MG PO TABS
650.0000 mg | ORAL_TABLET | Freq: Three times a day (TID) | ORAL | Status: DC | PRN
Start: 2023-04-10 — End: 2023-04-11
  Administered 2023-04-10: 650 mg via ORAL
  Filled 2023-04-10: qty 2

## 2023-04-10 MED ORDER — NALOXONE HCL 0.4 MG/ML IJ SOLN (WRAP)
0.2000 mg | INTRAMUSCULAR | Status: DC | PRN
Start: 2023-04-10 — End: 2023-04-12

## 2023-04-10 MED ORDER — HYDRALAZINE HCL 20 MG/ML IJ SOLN
10.0000 mg | Freq: Four times a day (QID) | INTRAMUSCULAR | Status: DC | PRN
Start: 2023-04-10 — End: 2023-04-12
  Administered 2023-04-10 – 2023-04-11 (×2): 10 mg via INTRAVENOUS
  Filled 2023-04-10 (×3): qty 1

## 2023-04-10 MED ORDER — FAMOTIDINE 10 MG/ML IV SOLN (WRAP)
20.0000 mg | Freq: Two times a day (BID) | INTRAVENOUS | Status: DC
Start: 2023-04-10 — End: 2023-04-11
  Administered 2023-04-10 – 2023-04-11 (×3): 20 mg via INTRAVENOUS
  Filled 2023-04-10 (×3): qty 2

## 2023-04-10 MED ORDER — SUCRALFATE 1 G PO TABS
1.0000 g | ORAL_TABLET | Freq: Four times a day (QID) | ORAL | 0 refills | Status: DC
Start: 2023-04-10 — End: 2023-04-12

## 2023-04-10 MED ORDER — PANTOPRAZOLE SODIUM 40 MG IV SOLR
40.0000 mg | Freq: Every day | INTRAVENOUS | Status: DC
Start: 2023-04-10 — End: 2023-04-11
  Administered 2023-04-10 – 2023-04-11 (×2): 40 mg via INTRAVENOUS
  Filled 2023-04-10 (×2): qty 40

## 2023-04-10 MED ORDER — GLUCAGON 1 MG IJ SOLR (WRAP)
1.0000 mg | INTRAMUSCULAR | Status: DC | PRN
Start: 2023-04-10 — End: 2023-04-11

## 2023-04-10 MED ORDER — AMOXICILLIN 250 MG PO CAPS
1000.0000 mg | ORAL_CAPSULE | Freq: Once | ORAL | Status: DC
Start: 2023-04-10 — End: 2023-04-10

## 2023-04-10 MED ORDER — ENOXAPARIN SODIUM 40 MG/0.4ML IJ SOSY
40.0000 mg | PREFILLED_SYRINGE | Freq: Every day | INTRAMUSCULAR | Status: AC
Start: 2023-04-10 — End: 2023-04-11
  Administered 2023-04-10 – 2023-04-11 (×2): 40 mg via SUBCUTANEOUS
  Filled 2023-04-10 (×2): qty 0.4

## 2023-04-10 MED ORDER — AMOXICILLIN 500 MG PO CAPS
1000.0000 mg | ORAL_CAPSULE | Freq: Two times a day (BID) | ORAL | 0 refills | Status: DC
Start: 2023-04-10 — End: 2023-04-12

## 2023-04-10 MED ORDER — POTASSIUM CHLORIDE CRYS ER 20 MEQ PO TBCR
40.0000 meq | EXTENDED_RELEASE_TABLET | Freq: Once | ORAL | Status: AC
Start: 2023-04-10 — End: 2023-04-10
  Administered 2023-04-10: 40 meq via ORAL
  Filled 2023-04-10: qty 2

## 2023-04-10 MED ORDER — BENZONATATE 100 MG PO CAPS
100.0000 mg | ORAL_CAPSULE | Freq: Three times a day (TID) | ORAL | Status: DC | PRN
Start: 2023-04-10 — End: 2023-04-12

## 2023-04-10 MED ORDER — IOHEXOL 350 MG/ML IV SOLN
100.0000 mL | Freq: Once | INTRAVENOUS | Status: AC | PRN
Start: 2023-04-10 — End: 2023-04-10
  Administered 2023-04-10: 100 mL via INTRAVENOUS

## 2023-04-10 MED ORDER — HYDRALAZINE HCL 20 MG/ML IJ SOLN
10.0000 mg | Freq: Once | INTRAMUSCULAR | Status: AC
Start: 2023-04-10 — End: 2023-04-10
  Administered 2023-04-10: 10 mg via INTRAVENOUS
  Filled 2023-04-10: qty 1

## 2023-04-10 MED ORDER — ALUM & MAG HYDROXIDE-SIMETH 200-200-20 MG/5ML PO SUSP
30.0000 mL | ORAL | Status: DC | PRN
Start: 2023-04-10 — End: 2023-04-12
  Administered 2023-04-10: 30 mL via ORAL
  Filled 2023-04-10: qty 30

## 2023-04-10 MED ORDER — DEXTROSE 50 % IV SOLN
12.5000 g | INTRAVENOUS | Status: DC | PRN
Start: 2023-04-10 — End: 2023-04-11

## 2023-04-10 MED ORDER — SALINE SPRAY 0.65 % NA SOLN
2.0000 | NASAL | Status: DC | PRN
Start: 2023-04-10 — End: 2023-04-12

## 2023-04-10 MED ORDER — POTASSIUM & SODIUM PHOSPHATES 280-160-250 MG PO PACK
2.0000 | PACK | ORAL | Status: DC | PRN
Start: 2023-04-10 — End: 2023-04-12

## 2023-04-10 MED ORDER — POTASSIUM CHLORIDE CRYS ER 20 MEQ PO TBCR
0.0000 meq | EXTENDED_RELEASE_TABLET | ORAL | Status: DC | PRN
Start: 2023-04-10 — End: 2023-04-12
  Administered 2023-04-11: 20 meq via ORAL
  Filled 2023-04-10: qty 1
  Filled 2023-04-10: qty 2

## 2023-04-10 MED ORDER — CARBOXYMETHYLCELLULOSE SOD PF 0.5 % OP SOLN
1.0000 [drp] | Freq: Three times a day (TID) | OPHTHALMIC | Status: DC | PRN
Start: 2023-04-10 — End: 2023-04-12

## 2023-04-10 MED ORDER — PLASMA-LYTE A IV INFUSION
INTRAVENOUS | Status: AC
Start: 2023-04-10 — End: 2023-04-10

## 2023-04-10 MED ORDER — BENZOCAINE-MENTHOL MT LOZG (WRAP)
1.0000 | LOZENGE | OROMUCOSAL | Status: DC | PRN
Start: 2023-04-10 — End: 2023-04-12

## 2023-04-10 MED ORDER — AMLODIPINE BESYLATE 5 MG PO TABS
10.0000 mg | ORAL_TABLET | Freq: Every day | ORAL | Status: DC
Start: 2023-04-10 — End: 2023-04-12
  Administered 2023-04-10 – 2023-04-12 (×3): 10 mg via ORAL
  Filled 2023-04-10 (×3): qty 2

## 2023-04-10 MED ORDER — SUCRALFATE 1 G PO TABS
1.0000 g | ORAL_TABLET | Freq: Four times a day (QID) | ORAL | Status: DC
Start: 2023-04-10 — End: 2023-04-11
  Administered 2023-04-10 – 2023-04-11 (×5): 1 g via ORAL
  Filled 2023-04-10 (×5): qty 1

## 2023-04-10 MED ORDER — PANTOPRAZOLE SODIUM 40 MG PO TBEC
40.0000 mg | DELAYED_RELEASE_TABLET | Freq: Every day | ORAL | 0 refills | Status: DC
Start: 2023-04-10 — End: 2023-04-12

## 2023-04-10 MED ORDER — MAGNESIUM SULFATE IN D5W 1-5 GM/100ML-% IV SOLN
1.0000 g | INTRAVENOUS | Status: DC | PRN
Start: 2023-04-10 — End: 2023-04-12

## 2023-04-10 MED ORDER — GLUCOSE 40 % PO GEL (WRAP)
15.0000 g | ORAL | Status: DC | PRN
Start: 2023-04-10 — End: 2023-04-11

## 2023-04-10 MED ORDER — POTASSIUM CHLORIDE 10 MEQ/100ML IV SOLN
10.0000 meq | INTRAVENOUS | Status: DC | PRN
Start: 2023-04-10 — End: 2023-04-12
  Administered 2023-04-11 (×2): 10 meq via INTRAVENOUS
  Filled 2023-04-10 (×2): qty 100

## 2023-04-10 MED ORDER — MELATONIN 3 MG PO TABS
3.0000 mg | ORAL_TABLET | Freq: Every evening | ORAL | Status: DC | PRN
Start: 2023-04-10 — End: 2023-04-12

## 2023-04-10 MED ORDER — DEXTROSE 10 % IV BOLUS
12.5000 g | INTRAVENOUS | Status: DC | PRN
Start: 2023-04-10 — End: 2023-04-11

## 2023-04-10 MED ORDER — HYDROCHLOROTHIAZIDE 25 MG PO TABS
25.0000 mg | ORAL_TABLET | Freq: Every day | ORAL | Status: DC
Start: 2023-04-10 — End: 2023-04-11
  Administered 2023-04-10: 25 mg via ORAL
  Filled 2023-04-10 (×3): qty 1

## 2023-04-10 NOTE — Progress Notes (Signed)
[facility] ADMISSION    Patient admitted to the hospital on 04/09/2023     Admitted from:ED via: stretcher family:present : no    Originally stays ZO:[id number]    Orientation: room, Call bell: within reach    Telemetry: is the pt on tele? Yes visible on screen: yes    Belongings: shirt, sweater, pants, underpants, shoe, socks, cell phone      Any home meds: none     Prior to admission, Have you had any loose BM within 48 hours?  no     4 eyes in 4 hours pressure injury assessment note:      Completed with: Gayathari RN  Unit NT9 & Time admitted: 0700            Bony Prominences: Check appropriate box; if wound is present enter wound assessment in LDA     Occiput:                 [x] WNL  []  Wound present  Face:                     [x] WNL  []  Wound present  Ears:                      [x] WNL  []  Wound present  Spine:                    [x] WNL  []  Wound present  Shoulders:             [x] WNL  []  Wound present  Elbows:                  [x] WNL  []  Wound present  Sacrum/coccyx:     [x] WNL  []  Wound present  Ischial Tuberosity:  [x] WNL  []  Wound present  Trochanter/Hip:      [x] WNL  []  Wound present  Knees:                   [x] WNL  []  Wound present  Ankles:                   [x] WNL  []  Wound present  Heels:                    [x] WNL  []  Wound present  Other pressure areas:  []  Wound location       Device related: []  Device name:       If wound is present, please describe here:         LDA completed if wound present: yes/no  Consult WOCN if necessary    Other skin related issues, ie tears, rash, etc: scattered scare, and skin tags       Wound care Consult: needed, entered? no    Review of Systems    Peripheral IV 04/09/23 20 G Standard Left Antecubital (Active)   Number of days: 0         @FOLEY @      Admission Note:patient is AXOX4, admitted from ED via stretcher. denies pain. Oriented to the new environment. Call light within reach.

## 2023-04-10 NOTE — Plan of Care (Signed)
Problem: Pain interferes with ability to perform ADL  Goal: Pain at adequate level as identified by patient  Outcome: Progressing  Flowsheets (Taken 04/10/2023 1400)  Pain at adequate level as identified by patient:   Identify patient comfort function goal   Assess for risk of opioid induced respiratory depression, including snoring/sleep apnea. Alert healthcare team of risk factors identified.   Assess pain on admission, during daily assessment and/or before any "as needed" intervention(s)   Reassess pain within 30-60 minutes of any procedure/intervention, per Pain Assessment, Intervention, Reassessment (AIR) Cycle     Problem: Side Effects from Pain Analgesia  Goal: Patient will experience minimal side effects of analgesic therapy  Outcome: Progressing  Flowsheets (Taken 04/10/2023 1400)  Patient will experience minimal side effects of analgesic therapy:   Monitor/assess patient's respiratory status (RR depth, effort, breath sounds)   Prevent/manage side effects per LIP orders (i.e. nausea, vomiting, pruritus, constipation, urinary retention, etc.)   Assess for changes in cognitive function

## 2023-04-10 NOTE — Plan of Care (Addendum)
47 year old male with no known past medical history who was admitted this morning for severe epigastric abdominal pain ongoing for 3 days prior to admission. Unknown etiology at this time but possibly due to gastritis      Plan:  -Start PPI IV daily, sucralfate PO QID. Continue famotidine IV BID  -Start IVF @125  cc/hr for 10 hours  -continue Amlodipine , HCTZ  -Re-evaluate in the AM  -A.1: 9.5. Endocrine consulted for new onset DM. Secure chat sent         Lincoln Brigham, M.D.   Hospitalist Division  Department of Medicine   Internal Medicine  T: [phone]  F: [phone]    I am available for questions from 7AM-6PM. After hours, please page the Cross-Coverage Hospitalist at [phone].  Preferably Reached by [employer]  Pager [phone]

## 2023-04-10 NOTE — H&P (Signed)
ADMISSION HISTORY AND PHYSICAL EXAM    Date Time: 04/10/23 6:29 AM  Patient Name: Mark [name] A  Attending Physician: Lincoln Brigham, MD  Primary Care Physician: Pcp, None, MD    CC: Severe epigastric abdominal pain      Assessment:   This is 47 year old male with no known past medical history presented after he started having severe epigastric abdominal pain since Friday/3 days.  At that day patient stated that he ate tacos outside from 7/11 and since then has been having severe abdominal pain.  Normally eats homemade food at work .  He did notice 1 time black stool but also stated that he thinks because of medications that he took.  He does not have any history of reflux disease.  Patient also denied having any diarrhea or any sick contact.  Denied having any vomiting.  And stated the pain is located in epigastric area does not radiate.  Patient does not have any known history of hypertension and denied drinking on a daily basis.  Patient also denied using alcohol or smoking.    Patient denied having any chest pain and noted he was tender around his epigastric area 2.  Denied having any shortness of breath.  Patient denied having any headache or blurring of vision.  -Emergency room patient was worked up for abdominal pain including CT scan which was negative for any acute process, right upper quadrant ultrasound showing hepatic steatosis, white count normal hemoglobin 18 and CMP normal.  -UA was noted for +3 glucose    Severe epigastric abdominal pain differential included pancreatitis but lipase is normal and CT scan negative, likely severe gastritis  Hypertensive urgency  Hepatic steatosis  Rule out diabetes will send A1c    Plan:   Patient will be admitted under observation on telemetry for blood pressure control.  At the time of my evaluation patient blood pressure has been fluctuating between 170s to 180s  -Will start p.o. antihypertensive medication with as needed coverage and if persistently  high consider IV drip.  -Also send A1c  -Continue patient on Pepcid and GI cocktail.  -    Additional Diagnoses:     Patient has BMI=Body mass index is 32.11 kg/m.  Diagnosis: Obesity based on BMI criteria          Recent Labs     04/09/23  2154   Potassium 3.3*     Diagnosis: Hypokalemia           Disposition: (Please see PAF column for Expected D/C Date)   Today's date: 04/10/2023  Admit Date: 04/09/2023 10:14 PM  Service status: Observation  Clinical Milestones:   Anticipated discharge needs:     History of Presenting Illness:   Mark [name] is a 47 y.o. male with no known past medical history presented after he started having severe epigastric abdominal pain since Friday/3 days.  At that day patient stated that he ate tacos outside from 7/[employer] and since then has been having severe abdominal pain.  Normally eats homemade food at work .  He did notice 1 time black stool but also stated that he thinks because of medications that he took.  He does not have any history of reflux disease.  Patient also denied having any diarrhea or any sick contact.  Denied having any vomiting.  And stated the pain is located in epigastric area does not radiate.  Patient does not have any known history of hypertension and denied drinking on a daily basis.  Patient also denied using alcohol or smoking.    Past Medical History:   History reviewed. No pertinent past medical history.    Available old records reviewed, including:      Past Surgical History:   History reviewed. No pertinent surgical history.    Family History:       Social History:     Social History     Tobacco Use   Smoking Status Never   Smokeless Tobacco Never     Social History     Substance and Sexual Activity   Alcohol Use Not Currently     Social History     Substance and Sexual Activity   Drug Use Never       Allergies:   No Known Allergies    Medications:     Home Medications       Med List Status: In Progress Set By: Carver Fila, RN at  04/09/2023 10:33 PM   No Medications           Method by which medications were confirmed on admission: Patient    Review of Systems:   All other systems were reviewed and are negative except: HPI    Physical Exam:   Patient Vitals for the past 24 hrs:   BP Temp Temp src Pulse Resp SpO2 Height Weight   04/10/23 0611 (!) 175/106 -- -- 70 -- -- -- --   04/10/23 0550 (!) 184/109 -- -- 65 18 92 % -- --   04/10/23 0449 (!) 188/118 -- -- 65 18 96 % -- --   04/10/23 0351 (!) 217/128 -- -- 76 18 -- -- --   04/10/23 0243 (!) 222/127 -- -- 68 13 95 % -- --   04/10/23 0221 (!) 201/112 -- -- 76 14 96 % -- --   04/10/23 0210 (!) 218/116 -- -- 66 15 95 % -- --   04/10/23 0146 (!) 194/119 -- -- -- -- -- -- --   04/10/23 0103 (!) 235/138 -- -- 63 18 96 % -- --   04/09/23 2147 (!) 201/144 98.3 F (36.8 C) Oral 92 22 97 % 1.702 m (5\' 7" ) 93 kg (205 lb)   04/09/23 2139 -- -- -- (!) 101 -- 97 % -- --     Body mass index is 32.11 kg/m.  No intake or output data in the 24 hours ending 04/10/23 0629    General: awake, alert, oriented x 3; no acute distress.  HEENT: perrla, eomi, sclera anicteric  oropharynx clear without lesions, mucous membranes moist  Neck: supple, no lymphadenopathy, no thyromegaly, no JVD, no carotid bruits  Cardiovascular: regular rate and rhythm, no murmurs, rubs or gallops  Lungs: clear to auscultation bilaterally, without wheezing, rhonchi, or rales  Abdomen: soft, epigastric tenderness , non-distended; no palpable masses, no hepatosplenomegaly, normoactive bowel sounds, no rebound or guarding  Extremities: no clubbing, cyanosis, or edema  Neuro: cranial nerves grossly intact, strength 5/5 in upper and lower extremities, sensation intact  Skin: no rashes or lesions noted  Other:       Labs:     Results       Procedure Component Value Units Date/Time    Urinalysis Reflex to Microscopic Exam- Reflex to Culture [[id number]]  (Abnormal) Collected: 04/10/23 0401    Specimen: Urine, Clean Catch Updated: 04/10/23 0523      Urine Type Urine, Clean Ca     Color, UA Yellow     Clarity, UA Clear  Specific Gravity UA 1.044     Urine pH 7.5     Leukocyte Esterase, UA Negative     Nitrite, UA Negative     Protein, UR 30= 1+     Glucose, UA 500= 3+     Ketones UA Negative     Urobilinogen, UA Normal mg/dL      Bilirubin, UA Negative     Blood, UA Negative     RBC, UA 0-2 /hpf      WBC, UA 0-5 /hpf      Squamous Epithelial Cells, Urine 0-5 /hpf     High Sensitivity Troponin-I at 0 hrs [[id number]] Collected: 04/10/23 0115    Specimen: Blood Updated: 04/10/23 0211     hs Troponin-I 17.6 ng/L     Comprehensive metabolic panel [[id number]]  (Abnormal) Collected: 04/09/23 2154    Specimen: Blood Updated: 04/09/23 2319     Glucose 233 mg/dL      BUN 86.5 mg/dL      Creatinine 1.0 mg/dL      Sodium 784 mEq/L      Potassium 3.3 mEq/L      Chloride 100 mEq/L      CO2 25 mEq/L      Calcium 9.1 mg/dL      Protein, Total 8.3 g/dL      Albumin 4.3 g/dL      AST (SGOT) 46 U/L      ALT 124 U/L      Alkaline Phosphatase 114 U/L      Bilirubin, Total 1.1 mg/dL      Globulin 4.0 g/dL      Albumin/Globulin Ratio 1.1     Anion Gap 11.0     eGFR >60.0 mL/min/1.73 m2     Lipase [[id number]] Collected: 04/09/23 2154    Specimen: Blood Updated: 04/09/23 2319     Lipase 28 U/L     CBC and differential [[id number]]  (Abnormal) Collected: 04/09/23 2154    Specimen: Blood Updated: 04/09/23 2259     WBC 8.48 x10 3/uL      Hgb 18.0 g/dL      Hematocrit 72.5 %      Platelets 229 x10 3/uL      RBC 5.83 x10 6/uL      MCV 84.4 fL      MCH 30.9 pg      MCHC 36.6 g/dL      RDW 12 %      MPV 12.6 fL      Instrument Absolute Neutrophil Count 5.40 x10 3/uL      Neutrophils 63.7 %      Lymphocytes Automated 26.1 %      Monocytes 7.3 %      Eosinophils Automated 2.0 %      Basophils Automated 0.8 %      Immature Granulocytes 0.1 %      Nucleated RBC 0.0 /100 WBC      Neutrophils Absolute 5.40 x10 3/uL      Lymphocytes Absolute Automated 2.21 x10 3/uL      Monocytes Absolute  Automated 0.62 x10 3/uL      Eosinophils Absolute Automated 0.17 x10 3/uL      Basophils Absolute Automated 0.07 x10 3/uL      Immature Granulocytes Absolute 0.01 x10 3/uL      Absolute NRBC 0.00 x10 3/uL             Imaging personally reviewed, including:     Safety Checklist  DVT prophylaxis:  CHEST guideline (See page [id number]) Chemical   Foley:  [facility] Rn Foley protocol Not present   IVs:  Peripheral IV   PT/OT: Not needed   Daily CBC & or Chem ordered:  SHM/ABIM guidelines (see #5) Yes, due to clinical and lab instability       Signed by: Larena Sox, MD  cc:Pcp, None, MD

## 2023-04-10 NOTE — Plan of Care (Signed)
RN notified cross cover persistent hypertension with SBP greater than 180 after as needed hydralazine given and abdominal discomfort in a patient presenting with severe epigastric abdominal pain and hypertensive urgency.  One-time dose labetalol 20 mg ordered and as needed Maalox.    1610 RN notified cross cover of abdominal pain 7/10. One time dose Morphine 1mg  ordered.

## 2023-04-10 NOTE — ED to IP RN Note (Signed)
[facility]PITAL EMERGENCY DEPT  ED NURSING NOTE FOR THE RECEIVING INPATIENT NURSE   ED NURSE Alcus Dad [id number]   ED CHARGE RN Cait   ADMISSION INFORMATION   Mark [name] is a 47 y.o. male admitted with an ED diagnosis of:    1. Acute gastritis without hemorrhage, unspecified gastritis type    2. Hypertension, unspecified type         Isolation: None   Allergies: Patient has no known allergies.   Holding Orders confirmed? No   Belongings Documented? No   Home medications sent to pharmacy confirmed? No   NURSING CARE   Patient Comes From:   Mental Status: Home Independent  alert and oriented   ADL: Independent with all ADLs   Ambulation: no difficulty   Pertinent Information  and Safety Concerns:     Broset Violence Risk Level: Low Patient with persisting elevated high blood pressure.  Doubt aortic dissection much of the aorta visualized.  Neurovascularly intact.  Patient given dose of hydralazine without significant response.  Repeat dose of hydralazine.  Given dose of labetalol currently blood pressure is 170s over 100.  Heart rate on lower side.  EKG no obvious ischemia.  Initial troponin 17.  Doubt ACS we will get repeat trop  will plan on admitting to medicine service for hypertensive urgency.    Mark [name] is a 47 y.o. male who presents with progressive worsening abdominal pain.  Patient with 2 to 3 days of pain.  Progressive worsening mid abdomen using over-the-counter medicines with some initial relief now nothing improving.  Nausea no vomiting no diarrhea no history of ulcers pancreas problems no history of abdominal surgery does not regular drink alcohol      CT / NIH   CT Head ordered on this patient?  No   NIH/Dysphagia assessment done prior to admission? No   VITAL SIGNS (at the time of this note)      Vitals:    04/10/23 0449   BP: (!) 188/118   Pulse: 65   Resp: 18   Temp:    SpO2: 96%     Pain Score: 8-severe pain (04/10/23 0103)

## 2023-04-10 NOTE — Discharge Instructions (Signed)
Gracias por escoger al departamento de emergencias del [facility], el mejor departamento en clase del rea [facility] [place] Espero que su visita de hoy haya sido EXCELENTE. Usted recibir una encuesta por mensaje de texto que le dar la oportunidad de hacerle comentarios a su equipo sobre su visita. No dude en comunicarse con nosotros si tiene alguna pregunta.      Thank you for choosing the [facility] Emergency Department, the premier emergency department in the [place] area.  I hope your visit today was EXCELLENT. You will receive a survey via text message that will give you the opportunity to provide feedback to your team about your visit. Please do not hesitate to reach out with any questions!                    Instrucciones especficas para su consulta de hoy:      Specific instructions for your visit today:                  SI USTED NO SIGUE MEJORANDO O SI SU CONDICIN EMPEORA, COMUNQUESE CON SU MDICO O ACUDA DE INMEDIATO AL DEPARTAMENTO DE EMERGENCIAS.        IF YOU DO NOT CONTINUE TO IMPROVE OR YOUR CONDITION WORSENS, PLEASE CONTACT YOUR DOCTOR OR RETURN IMMEDIATELY TO THE EMERGENCY DEPARTMENT.                    Atentamente,  Nazareth Norenberg E, MD  Mdico especialista en emergencias  Departamento de emergencias de [facility]        Sincerely,  Lael Wetherbee E, MD  Attending Emergency Physician   [facility] Emergency Department                CMO OBTENER UNA CITA PARA RECIBIR ATENCIN MDICA PRIMARIA    Los mdicos de atencin primaria (PCP, por sus siglas en ingls) son internistas o mdicos de cabecera. Ambos tipos de PCP se enfocan en el fomento de la salud, la prevencin de enfermedades, la educacin y la asesora del paciente y el tratamiento de condiciones mdicas agudas y crnicas.    Llame para concertar una cita con un mdico de atencin primaria.  Pregunte qu mdico est recibiendo pacientes nuevos.     [facility]  Telfono: [phone]  [email or web address]     OBTAINING A PRIMARY CARE APPOINTMENT    Primary care physicians (PCPs, also known as primary care doctors) are either internists or family medicine doctors. Both types of PCPs focus on health promotion, disease prevention, patient education and counseling, and treatment of acute and chronic medical conditions.    Call for an appointment with a primary care doctor.  Ask to see who is taking new patients.       [facility]  telephone:  [phone]  [email or web address]             REMISIONES A MDICOS  Llame al [phone] (disponible las 24 horas al da, los 7 das de la semana) si necesita una remisin y podremos ayudarlo a encontrar un mdico de atencin primaria o un especialista.  Tambin est disponible en lnea en la pgina web: [email or web address]     DOCTOR REFERRALS  Call [phone] (available 24 hours a day, 7 days a week) if you need any further referrals and we can help you find a primary care doctor or specialist.  Also, available online at:  [email or web address]               INFORMACIN DE CONTACTO  Antes de marcharse, verifique en el registro que su nmero de contacto est actualizado.  Puede llamar al registro al [phone] para actualizar su informacin.  Si tiene preguntas sobre la factura del hospital, llame al [phone].  Si tiene preguntas sobre la factura del mdico del departamento de emergencias, llame al [phone].       YOUR CONTACT INFORMATION  Before leaving please check with registration to make sure we have an up-to-date contact number.  You can call registration at [phone] to update your information.  For questions about your hospital bill, please call [phone].  For questions about your Emergency Dept Physician bill please call [phone].               SERVICIOS MDICOS GRATUITOS  Si necesita ayuda con servicios mdicos o sociales, llame al 2-1-1 para  recibir informacin gratuita sobre los recursos en su rea.  2-1-1 es un servicio gratuito que brinda a las personas informacin sobre seguros mdicos, clnicas gratuitas, embarazo, salud mental, atencin dental, apoyo alimentario, vivienda y asesora para tratar la drogadiccin.  Tambin est disponible en lnea en la pgina web: [email or web address].     FREE HEALTH SERVICES  If you need help with health or social services, please call 2-1-1 for a free referral to resources in your area.  2-1-1 is a free service connecting people with information on health insurance, free clinics, pregnancy, mental health, dental care, food assistance, housing, and substance abuse counseling.  Also, available online at:  http://www.[facility].org             LESIONES ORTOPDICAS   Tenga en cuenta que puede haber lesiones considerables incluso en casos en los que una radiografa inicial arroje resultados normales o negativos.  Esto puede deberse a que algunas fracturas (huesos rotos) no son visibles inicialmente en las radiografas.  Por este motivo, es necesario que el mdico de atencin primaria o un especialista en huesos (ortopeda o traumatlogo) le hagan un seguimiento ambulatorio detallado.     ORTHOPEDIC INJURY   Please know that significant injuries can exist even when an initial x-ray is read as normal or negative.  This can occur because some fractures (broken bones) are not initially visible on x-rays.  For this reason, close outpatient follow-up with your primary care doctor or bone specialist (orthopedist) is required.             MEDICAMENTOS Y SEGUIMIENTO  Tenga en cuenta que algunos medicamentos prescritos pueden producir somnolencia.  Sea cuidadoso al conducir u operar maquinaria mientras toma estos medicamentos.    Las evaluaciones y los tratamientos que recibi en nuestro departamento de emergencias se proveen en caso de emergencia y no tienen el propsito de reemplazar a su mdico de atencin primaria.   Es importante que su mdico lo examine de nuevo y que usted le notifique cualquier problema nuevo o que persista.       MEDICATIONS AND FOLLOWUP  Please be aware that some prescription medications can cause drowsiness.  Use caution when driving or operating machinery.    The examination and treatment you have received in our Emergency Department is provided on an emergency basis, and is not intended to be a substitute for your primary care physician.  It is important that your doctor checks you again and that you report any new or remaining problems at that time.               ASISTENCIA CON EL SEGURO    Ley de   Atencin de Salud Asequible (ACA, por sus siglas en ingls)  Llame para comenzar o terminar una solicitud, comparar planes, inscribirse o hacer preguntas.  1-800-318-2596  TTY: [phone]  Pgina web: Healthcare.gov    Ayuda para inscribirse en Medicaid  [facility] [place]  [phone] (LNEA GRATUITA)  [phone] (TTY)  Pgina web: http://www.coverva.org    Ayuda local para inscribirse en la ACA  [facility] ([employer])  [phone] (CENTRAL TELEFNICA)  Correo electrnico: health-help@nvfs.org  Pgina web: http://www.nvfs.org  [place]: [address], [address] [place], [place] [address]     ASSISTANCE WITH INSURANCE    Affordable Care Act  (ACA)  Call to start or finish an application, compare plans, enroll or ask a question.  1-800-318-2596  TTY: 1-855-889-4325  Web:  Healthcare.gov    Help Enrolling in Medicaid  Cover [place]  ([phone] [phone] (TOLL-FREE)  ([phone] 221-1590 (TTY)  Web:  Http://www.coverva.org    Local Help Enrolling in the ACA  [facility]  ([phone] (MAIN)  Email:  health-help@nvfs.org  Web:  Http://www.nvfs.org  Address:  [address], [address] [place], [place] [address]             MEDICAMENTOS SEDANTES  Los medicamentos sedantes abarcan medicamentos fuertes para el dolor (p. ej., narcticos), relajantes musculares,  benzodiacepinas (se usan para combatir la ansiedad y como relajante muscular), Benadryl o difenhidramina y dems antihistamnicos que se usan para combatir las reacciones alrgicas o la comezn y otros medicamentos.  Si no est seguro si se le suministr un medicamento sedante, pregunte a su mdico o enfermero(a).  Si se le suministr un medicamento sedante: NO conduzca un automvil. NO opere maquinaria. NO realice trabajos en los que deba estar alerta.  NO consuma bebidas alcohlicas mientras tome este medicamento.   SEDATING MEDICATIONS  Sedating medications include strong pain medications (e.g. narcotics), muscle relaxers, benzodiazepines (used for anxiety and as muscle relaxers), Benadryl/diphenhydramine and other antihistamines for allergic reactions/itching, and other medications.  If you are unsure if you have received a sedating medication, please ask your physician or nurse.  If you received a sedating medication: DO NOT drive a car. DO NOT operate machinery. DO NOT perform jobs where you need to be alert.  DO NOT drink alcoholic beverages while taking this medicine.               Si se siente mareado, sintese o recustese al observar los primeros sntomas. Suba y baje las escaleras con cuidado.  Tenga extremo cuidado para evitar cadas.   If you get dizzy, sit or lie down at the first signs. Be careful going up and down stairs.  Be extra careful to prevent falls.             Nunca d este medicamento a otras personas.   Never give this medicine to others.             Mantenga este medicamento fuera del alcance de los nios.   Keep this medicine out of reach of children.             No tome o guarde medicamentos viejos. Deschelos cuando se venzan.     Guarde los medicamentos en un lugar seco y fresco. NO los guarde en el gabinete de medicinas del bao o en un gabinete que se encuentre encima de la estufa.   Do not take or save old medicines. Throw them away when outdated.     Keep all medicines in a   cool,  dry place. DO NOT keep them in your bathroom medicine cabinet or in a cabinet above the stove.             RESURTIDO DE MEDICAMENTOS  Tenga en cuenta que no podemos resurtir medicamentos prescritos en la sala de emergencias. Si necesita un tratamiento adicional al que se le da en la sala de emergencias, consulte a su mdico de atencin primaria o al especialista en el tratamiento del dolor.   MEDICATION REFILLS  Please be aware that we cannot refill any prescriptions through the ER. If you need further treatment from what is provided at your ER visit, please follow up with your primary care doctor or your pain management specialist.             DEPARTAMENTOS INDEPENDIENTES DE EMERGENCIAS DE [facility]  Saba que [place] cuenta con dos salas de emergencias independientes a unas cuantas millas?  En la sala de emergencias de [facility] en [place] y en la sala de emergencias en [place] el tiempo de espera es corto, el estacionamiento es gratis en frente del edificio y reciben altas calificaciones de satisfaccin por parte de los pacientes. Igualmente, estas cuentan con los mismos mdicos de emergencias certificados de [facility].     FREESTANDING EMERGENCY DEPARTMENTS OF [facility]  Did you know [place] has two freestanding ERs located just a few miles away?  [facility] [place] and [facility] [place] have short wait times, easy free parking directly in front of the building and top patient satisfaction scores - and the same Board Certified Emergency Medicine doctors as  [facility].

## 2023-04-10 NOTE — UM Notes (Signed)
04/10/23 0450  Adult Admit to Observation  Once         04/10/23 0451     Initial OBS review  Unit: Medicine    47 year old male with no known past medical history presented after he started having severe epigastric abdominal pain since Friday/3 days.  At that day patient stated that he ate tacos outside from 7/11 and since then has been having severe abdominal pain.  He did notice 1 time black stool but also stated that he thinks because of medications that he took.      Severe epigastric abdominal pain differential included pancreatitis but lipase is normal and CT scan negative, likely severe gastritis  Hypertensive urgency  Hepatic steatosis  Rule out diabetes will send A1c    ED Course:  VS: 98.3, 101, 22, 201/144, 97%  Labs: hgb 18.0, glucose 233, K 3.3, AST 46, ALT 124,   UA: specific gravity 1.044, protein 1+, glucose 3+  US Abdomen: Hepatic steatosis  CT A/P with IV contrast: No acute abnormality. Nonacute findings detailed above.   EKG: NSR  Meds: Zofran 4 mg IV X 1, Sodium chloride 0.9% IV 1000 ml bolus X 1, Pepcid 20 mg IV X 1, Hydralazine 10 mg IV X 1, Labetalol 20 mg IV x 2    Current Facility-Administered Medications   Medication Dose Route Frequency    amLODIPine  10 mg Oral Daily    famotidine  20 mg Intravenous Q12H SCH    hydroCHLOROthiazide  25 mg Oral Daily     Continuous Infusions:  PRN Meds:.hydrALAZINE    Plan:  Patient will be admitted under observation on telemetry for blood pressure control.  At the time of my evaluation patient blood pressure has been fluctuating between 170s to 180s  -Will start p.o. antihypertensive medication with as needed coverage and if persistently high consider IV drip.  -Also send A1c  -Continue patient on Pepcid and GI cocktail.    Pollyann Kennedy, RN, BSN CNRN  UR Case Manager   Utilization Review  [facility]   [address]  [address]  [place], [place] [address]  Phone : [phone]   [place] [phone]  Fax: [phone]    [email or web address]

## 2023-04-11 ENCOUNTER — Encounter (INDEPENDENT_AMBULATORY_CARE_PROVIDER_SITE_OTHER): Payer: Self-pay

## 2023-04-11 ENCOUNTER — Encounter: Payer: Self-pay | Admitting: Internal Medicine

## 2023-04-11 DIAGNOSIS — R739 Hyperglycemia, unspecified: Secondary | ICD-10-CM

## 2023-04-11 DIAGNOSIS — E119 Type 2 diabetes mellitus without complications: Secondary | ICD-10-CM

## 2023-04-11 LAB — BASIC METABOLIC PANEL
Anion Gap: 11 (ref 5.0–15.0)
BUN: 14 mg/dL (ref 9.0–28.0)
CO2: 24 mEq/L (ref 17–29)
Calcium: 8.6 mg/dL (ref 8.5–10.5)
Chloride: 99 mEq/L (ref 99–111)
Creatinine: 0.9 mg/dL (ref 0.5–1.5)
Glucose: 232 mg/dL — ABNORMAL HIGH (ref 70–100)
Potassium: 3.4 mEq/L — ABNORMAL LOW (ref 3.5–5.3)
Sodium: 134 mEq/L — ABNORMAL LOW (ref 135–145)
eGFR: >60 mL/min/{1.73_m2} (ref >=60)

## 2023-04-11 LAB — CBC AND DIFFERENTIAL
Absolute NRBC: 0 10*3/uL (ref 0.00–0.00)
Basophils Absolute Automated: 0.06 10*3/uL (ref 0.00–0.08)
Basophils Automated: 0.8 %
Eosinophils Absolute Automated: 0.24 10*3/uL (ref 0.00–0.44)
Eosinophils Automated: 3 %
Hematocrit: 45.4 % (ref 37.6–49.6)
Hgb: 16.5 g/dL (ref 12.5–17.1)
Immature Granulocytes Absolute: 0.02 10*3/uL (ref 0.00–0.07)
Immature Granulocytes: 0.3 %
Instrument Absolute Neutrophil Count: 4.33 10*3/uL (ref 1.10–6.33)
Lymphocytes Absolute Automated: 2.51 10*3/uL (ref 0.42–3.22)
Lymphocytes Automated: 31.7 %
MCH: 30.8 pg (ref 25.1–33.5)
MCHC: 36.3 g/dL — ABNORMAL HIGH (ref 31.5–35.8)
MCV: 84.9 fL (ref 78.0–96.0)
MPV: 11.9 fL (ref 8.9–12.5)
Monocytes Absolute Automated: 0.77 10*3/uL (ref 0.21–0.85)
Monocytes: 9.7 %
Neutrophils Absolute: 4.33 10*3/uL (ref 1.10–6.33)
Neutrophils: 54.5 %
Nucleated RBC: 0 /100 WBC (ref 0.0–0.0)
Platelets: 210 10*3/uL (ref 142–346)
RBC: 5.35 10*6/uL (ref 4.20–5.90)
RDW: 12 % (ref 11–15)
WBC: 7.93 10*3/uL (ref 3.10–9.50)

## 2023-04-11 LAB — WHOLE BLOOD GLUCOSE POCT
Whole Blood Glucose POCT: 238 mg/dL — ABNORMAL HIGH (ref 70–100)
Whole Blood Glucose POCT: 241 mg/dL — ABNORMAL HIGH (ref 70–100)
Whole Blood Glucose POCT: 318 mg/dL — ABNORMAL HIGH (ref 70–100)
Whole Blood Glucose POCT: 226 mg/dL — ABNORMAL HIGH (ref 70–100)

## 2023-04-11 LAB — TRIGLYCERIDES: Triglycerides: 363 mg/dL — ABNORMAL HIGH (ref 34–149)

## 2023-04-11 MED ORDER — GLUCOSE 40 % PO GEL (WRAP)
15.0000 g | ORAL | Status: DC | PRN
Start: 2023-04-11 — End: 2023-04-12

## 2023-04-11 MED ORDER — INSULIN GLARGINE 100 UNIT/ML SC SOLN
10.0000 [IU] | Freq: Every morning | SUBCUTANEOUS | Status: DC
Start: 2023-04-11 — End: 2023-04-12
  Administered 2023-04-12: 10 [IU] via SUBCUTANEOUS
  Filled 2023-04-11: qty 10

## 2023-04-11 MED ORDER — DEXTROSE 10 % IV BOLUS
12.5000 g | INTRAVENOUS | Status: DC | PRN
Start: 2023-04-11 — End: 2023-04-12

## 2023-04-11 MED ORDER — POLYETHYLENE GLYCOL 3350 17 G PO PACK
17.0000 g | PACK | Freq: Every day | ORAL | Status: DC
Start: 2023-04-11 — End: 2023-04-12
  Administered 2023-04-11 – 2023-04-12 (×2): 17 g via ORAL
  Filled 2023-04-11 (×2): qty 1

## 2023-04-11 MED ORDER — DEXTROSE 50 % IV SOLN
12.5000 g | INTRAVENOUS | Status: DC | PRN
Start: 2023-04-11 — End: 2023-04-12

## 2023-04-11 MED ORDER — INSULIN LISPRO 100 UNIT/ML SOLN (WRAP)
1.0000 [IU] | Freq: Every evening | Status: DC
Start: 2023-04-11 — End: 2023-04-12
  Administered 2023-04-11: 1 [IU] via SUBCUTANEOUS
  Filled 2023-04-11: qty 3

## 2023-04-11 MED ORDER — INSULIN LISPRO 100 UNIT/ML SOLN (WRAP)
1.0000 [IU] | Freq: Three times a day (TID) | Status: DC
Start: 2023-04-11 — End: 2023-04-12
  Administered 2023-04-11 (×2): 2 [IU] via SUBCUTANEOUS
  Administered 2023-04-11: 4 [IU] via SUBCUTANEOUS
  Administered 2023-04-12: 1 [IU] via SUBCUTANEOUS
  Filled 2023-04-11: qty 12
  Filled 2023-04-11: qty 3
  Filled 2023-04-11 (×2): qty 6

## 2023-04-11 MED ORDER — ACETAMINOPHEN 325 MG PO TABS
650.0000 mg | ORAL_TABLET | Freq: Three times a day (TID) | ORAL | Status: DC | PRN
Start: 2023-04-11 — End: 2023-04-12

## 2023-04-11 MED ORDER — LISINOPRIL 10 MG PO TABS
10.0000 mg | ORAL_TABLET | Freq: Every day | ORAL | Status: DC
Start: 2023-04-11 — End: 2023-04-11
  Administered 2023-04-11: 10 mg via ORAL
  Filled 2023-04-11: qty 1

## 2023-04-11 MED ORDER — LISINOPRIL 20 MG PO TABS
20.0000 mg | ORAL_TABLET | Freq: Every day | ORAL | Status: DC
Start: 2023-04-12 — End: 2023-04-12
  Administered 2023-04-12: 20 mg via ORAL
  Filled 2023-04-11: qty 1

## 2023-04-11 MED ORDER — INSULIN GLARGINE 100 UNIT/ML SC SOLN
20.0000 [IU] | Freq: Every morning | SUBCUTANEOUS | Status: DC
Start: 2023-04-11 — End: 2023-04-12
  Administered 2023-04-11: 20 [IU] via SUBCUTANEOUS
  Filled 2023-04-11: qty 20

## 2023-04-11 MED ORDER — PANTOPRAZOLE SODIUM 40 MG IV SOLR
40.0000 mg | Freq: Two times a day (BID) | INTRAVENOUS | Status: DC
Start: 2023-04-11 — End: 2023-04-12
  Administered 2023-04-11 – 2023-04-12 (×2): 40 mg via INTRAVENOUS
  Filled 2023-04-11 (×2): qty 40

## 2023-04-11 MED ORDER — GLUCAGON 1 MG IJ SOLR (WRAP)
1.0000 mg | INTRAMUSCULAR | Status: DC | PRN
Start: 2023-04-11 — End: 2023-04-12

## 2023-04-11 MED ORDER — MORPHINE SULFATE 2 MG/ML IJ/IV SOLN (WRAP)
1.0000 mg | Freq: Once | Status: DC
Start: 2023-04-11 — End: 2023-04-12

## 2023-04-11 MED ORDER — SENNOSIDES-DOCUSATE SODIUM 8.6-50 MG PO TABS
1.0000 | ORAL_TABLET | Freq: Two times a day (BID) | ORAL | Status: DC
Start: 2023-04-11 — End: 2023-04-12
  Administered 2023-04-11 – 2023-04-12 (×3): 1 via ORAL
  Filled 2023-04-11 (×3): qty 1

## 2023-04-11 MED ORDER — LISINOPRIL 10 MG PO TABS
10.0000 mg | ORAL_TABLET | Freq: Once | ORAL | Status: AC
Start: 2023-04-11 — End: 2023-04-11
  Administered 2023-04-11: 10 mg via ORAL
  Filled 2023-04-11: qty 1

## 2023-04-11 NOTE — Consults (Signed)
Gastroenterology    CONSULT NOTE  Gastroenterology Consult Service - [facility]  Epic Chat (Group): [facility]  [address] [place], [place] [address]  Appointments: [phone] or [phone] for GI call center    Date Time: 04/11/23 11:07 AM  Patient Name: Lennon Alstrom A  Requesting Physician: Henry Russel, MD       Reason for Consultation:   Abdominal pain    Assessment and Recommendations:   72M with no known PMH who presented with epigastric pain x 4 days. CT imaging unrevealing. Symptoms concerning for PUD vs gastritis.     #epigastric pain   Suspect symptoms due to PUD (?h.pylori) vs gastritis/esophagitis. Consider gastroparesis given new dx DM, although denies n/v and early satiety. If EGD is negative would consider this as etiology of symptoms.   - CT A/P negative  - no anemia or concern for overt GI bleeding     #new dx DM  - hgb A1c 9.5%    Recommendations  - continue PPI IV BID  - stop carafate as it can make visualization during EGD challenging  - plan for EGD tentatively tomorrow, 4/25  - NPO midnight  - hold AM DVT ppx  - ensure has AM labs    Preparation for endoscopy: (criteria for safe sedation)  - K > 3.2 and < 5.6  - Na > 126 and < 145  - HgB > 7, and > 8 for cardiac history  - Oxygen saturation of 95% on 6 liters of oxygen or less  \\\  We will follow.  Case has been reviewed and discussed with the GI attending, Dr. Allena Napoleon, and plan of care formulated together.    Please call if clinical status changes and urgent need for GI    History:   Seen with assistance from [employer] Spanish interpretor ID [id number].     Kishawn A Andrzej Scully is a 47 y.o. male with no known PMH who presents to the hospital on 04/09/2023 with epigastric pain x 4 days. States epigastric pain radiating around his abdomen started suddenly on 4/20. Initially, intermittent, but now more frequent. Pain worsens with PO intake. Took some ibuprofen with some improvement of pain. Never  had this pain before. Denies nausea or vomiting. No hx GERD. Denies early satiety. Former ETOH use, but stopped drinking 2 yrs ago. Denies tobacco use. No other NSAID use aside from taking some ibuprofen after pain started. Reports a black stool a few months ago, but no melena or hematochezia currently. Denies unintentional weight loss. No family history of GI malignancy. No hx of prior scope.     Labs remarkable for hgb A1c 9.5%. CT A/p unremarkable. RUQ [place] w/hepatic steatosis     GI hx:  No prior scopes.       Past Medical History:   History reviewed. No pertinent past medical history.    Past Surgical History:   History reviewed. No pertinent surgical history.    Family History:   History reviewed. No pertinent family history.    Social History:     Social History     Socioeconomic History    Marital status: Married     Spouse name: Not on file    Number of children: Not on file    Years of education: Not on file    Highest education level: Not on file   Occupational History    Not on file   Tobacco Use    Smoking status:  Never    Smokeless tobacco: Never   Substance and Sexual Activity    Alcohol use: Not Currently    Drug use: Never    Sexual activity: Not on file   Other Topics Concern    Not on file   Social History Narrative    Not on file     Social Determinants of Health     Financial Resource Strain: Not on file   Food Insecurity: No Food Insecurity (04/09/2023)    Hunger Vital Sign     Worried About Running Out of Food in the Last Year: Never true     Ran Out of Food in the Last Year: Never true   Transportation Needs: No Transportation Needs (04/09/2023)    PRAPARE - Therapist, art (Medical): No     Lack of Transportation (Non-Medical): No   Physical Activity: Not on file   Stress: Not on file   Social Connections: Not on file   Intimate Partner Violence: Not At Risk (04/11/2023)    Humiliation, Afraid, Rape, and Kick questionnaire     Fear of Current or Ex-Partner: No      Emotionally Abused: No     Physically Abused: No     Sexually Abused: No   Housing Stability: Unknown (04/09/2023)    Housing Stability Vital Sign     Unable to Pay for Housing in the Last Year: No     Number of Places Lived in the Last Year: Not on file     Unstable Housing in the Last Year: No       Allergies:   No Known Allergies    Medications:     Current Facility-Administered Medications   Medication Dose Route Frequency    amLODIPine  10 mg Oral Daily    enoxaparin  40 mg Subcutaneous Daily    famotidine  20 mg Intravenous Q12H SCH    insulin glargine  20 Units Subcutaneous QAM    Or    insulin glargine  10 Units Subcutaneous QAM    insulin lispro  1-3 Units Subcutaneous QHS    insulin lispro  1-5 Units Subcutaneous TID AC    lisinopril  10 mg Oral Daily    morphine  1 mg Intravenous Once    pantoprazole  40 mg Intravenous Daily    polyethylene glycol  17 g Oral Daily    senna-docusate  1 tablet Oral BID    sucralfate  1 g Oral TID AC & HS       has a current medication list which includes the following prescription(s): amoxicillin - Take 2 capsules (1,000 mg) by mouth 2 (two) times daily for 10 days, pantoprazole - Take 1 tablet (40 mg) by mouth daily, and sucralfate - Take 1 tablet (1 g) by mouth 4 (four) times daily for 7 days, and the following Facility-Administered Medications: acetaminophen, alum & mag hydroxide-simethicone, amlodipine, benzocaine-menthol, benzonatate, carboxymethylcellulose (pf), dextrose **OR** dextrose **OR** dextrose **OR** glucagon (rdna), enoxaparin, famotidine, hydralazine, insulin glargine **OR** insulin glargine, insulin lispro **AND** NSG Communication: Glucose POCT order, insulin lispro **AND** NSG Communication: Glucose POCT order, lisinopril, magnesium sulfate, melatonin, morphine, naloxone, pantoprazole, polyethylene glycol, potassium & sodium phosphates, potassium chloride **AND** potassium chloride, saline, senna-docusate, sucralfate.     Review of Systems:    Constitutional: Denies fever, chills or weight loss.  EENT: Denies nasal discharge, epistaxis, oral lesions or sore throat.  Respiratory: Denies sob or cough.  Cardiovascular: Denies chest pain or  palpitations.  Gastrointestinal: See HPI.  Genitourinary: Denies gross hematuria or dysuria.  Musculoskeletal: Denies back pain or joint pain.  Neurologic: Denies confusion or headaches.  Psychiatric: Denies depression or anxiety.   Hematologic: Denies easy bruising.  Integumentary:  Denies skin rashes or jaundice.    Pertinent positives noted in HPI.     Physical Exam:     Vitals:    04/11/23 1030   BP: (!) 167/95   Pulse: 82   Resp:    Temp: 97.9 F (36.6 C)   SpO2: 95%       General: laying in bed, NAD  Skin: mmm  EENT: sclera anicteric, oropharynx clear  CV: RRR  Pulm: non labored respirations  GI: +BS, soft, non-tender, non-distended  Neuro: A&Ox3, moving extremities freely, no focal deficits noted  Psych: Appropriate affect.      Labs Reviewed:     Recent Labs   Lab 04/11/23  0256 04/09/23  2154   WBC 7.93 8.48   Hgb 16.5 18.0*   Hematocrit 45.4 49.2   Platelets 210 229       Recent Labs   Lab 04/11/23  0256 04/09/23  2154   Sodium 134* 136   Potassium 3.4* 3.3*   Chloride 99 100   CO2 24 25   BUN 14.0 15.0   Creatinine 0.9 1.0   Calcium 8.6 9.1   Albumin  --  4.3   Protein, Total  --  8.3   Bilirubin, Total  --  1.1   Alkaline Phosphatase  --  114   ALT  --  124*   AST (SGOT)  --  46*   Glucose 232* 233*   Lipase  --  28               Rads:   GI Radiological Procedures since admission  reviewed.   Radiology Results (24 Hour)       ** No results found for the last 24 hours. **                Signed by: Lavonia Drafts, PA-C

## 2023-04-11 NOTE — Progress Notes (Addendum)
Medicine Shift Note    Braden Scale Score: 22 (04/11/23 0900)  Skin Integrity: Scars  Patient Lines/Drains/Airways Status       Active Lines, Drains and Airways       Name Placement date Placement time Site Days    Peripheral IV 04/09/23 20 G Standard Left Antecubital 04/09/23  2226  Antecubital  1                      Skin  4 eyes in 4 hours pressure injury assessment note:      Completed with:             Bony Prominences: Check appropriate box; if wound is present enter wound assessment in LDA      Occiput:                 [x] WNL  []  Wound present  Face:                     [x] WNL  []  Wound present  Ears:                      [x] WNL  []  Wound present  Spine:                    [x] WNL  []  Wound present  Shoulders:             [x] WNL  []  Wound present  Elbows:                  [x] WNL  []  Wound present  Sacrum/coccyx:     [x] WNL  []  Wound present  Ischial Tuberosity:  [x] WNL  []  Wound present  Trochanter/Hip:      [x] WNL  []  Wound present  Knees:                   [x] WNL  []  Wound present  Ankles:                   [x] WNL  []  Wound present  Heels:                    [x] WNL  []  Wound present  Other pressure areas:  []  Wound location    If wound is present in any of the following from above, please describe findings below:         Device related: []  Device name:         LDA completed if wound present: yes/no    Consult WOCN: yes/no    Other skin related issues, ie tears, rash, etc, document in Integumentary flowsheet      Last BM: 4/22    Pending Orders: Labs    Discharge Plan: TBD    POC: pt    Tele: no    Activity:  independent    PT/OT: none    Interpreter Needs: Spanish speaking    Intake & Output for the shift: I/O this shift:  In: 250 [P.O.:250]  Out: -     Shift Note : pt A&o x4. SBP : 150-165. RA. Complained abd pain with discomfort but stated not enough to take pain medications. Provided education for the DM with DM management with insuline, medication, exercise, and diet. Verbalized understanding. MN NPO  for the EGD tomorrow. All meds administered based on the [facility]. All safety measure in place. Call bell in reach.

## 2023-04-11 NOTE — Consults (Signed)
NUTRITION EDUCATION CONSULT:    Consult received for DM diet education for patient Mark [name] - 47 y.o. male. Using spanish interpretor Dorene Grebe [phone], provided patient with both verbal and written instruction.  Patient appeared overwhelmed so encouraged him to start with limiting portion sizes of starches like breads, rice, pasta, and tortillas as well as sugar sweetened beverages like sodas, juices, and sports drinks. Patient verbalized understanding. Encouraged patient to ask about outpatient diabetes ed at his next doctors apportionment ([facility]).     Waunita Schooner, RDN

## 2023-04-11 NOTE — Consults (Signed)
Attending Assessment & Plan    Attending Note:     Diagnostic studies/Labs personally reviewed, including :   Whole Blood Glucose POCT   Date/Time Value Ref Range Status   04/11/2023 1005 238 (H) 70 - 100 mg/dL Final     I performed the substantive portion of this visit by personally conducting the Medical Decision Making in its entirety.  Patient was also seen by Lauren for the HPI and PE portions of the visit.  I reviewed and updated the documented findings and plan.    Patient seen and personally examined.  I concur with the findings and exam as documented by Mylinda Latina, NP with the following caveats:    Assessment / Plan:  [name] is [name] 47 y.o. man with h/o obesity, and hepatic steatosis who was admitted with epigastric pain possibly gastritis and found to have previously undiagnosised T2DM with BG > 200mg /dL and J8A of 4.1%.   Recommend Lantus 20 units q AM and decrease to 10 units if NPO while hospitalized.   Recommend hospital discharge on glipizide 5 mg daily and metformin 500 mg daily x 1 week, followed by BID thereafter.  Follow up with PCP.   See additional details below    [name], MD  [facility]  Spectralink: ext [phone] or pager ID: [phone]   Hours Mon. - Friday 8- 5:30pm     ENDOCRINE NEW CONSULT    Date Time: 04/11/23 4:25 PM  Patient Name: Mark [name]  Requesting Physician: Henry Russel, MD  Consulting Physician: Arman Bogus, NP  Admission Date: 04/09/2023    Primary Care Physician: Marisa Sprinkles, MD    Endocrinology Consultation for: Uncontrolled type 2 diabetes with hyperglycemia    Payor: /        Assessment/Recommendations:   [name] with past medical history of obesity, hepatic steatosis who presented to the hospital on 04/09/2023 with gastric pain and hyperglycemia, found to have gastritis and undiagnosed diabetes (hgba1c 9/5%).  Endocrinology consulted for management of newly diagnosed  diabetes.    Newly diagnosed uncontrolled type 2 diabetes with hyperglycemia:   Obesity  Lab Results   Component Value Date    HGBA1C 9.5 (H) 04/10/2023     Body mass index is 32.11 kg/m.  Hyperglycemia exacerbated by unknown diabetes status  Start Lantus 20 units qAM; Reduce by 50% if patient is NPO or BG is less than 120 mg/dl  Continue Lispro low dose SSI qAC/qHS for additional hyperglycemic coverage  Will continue to monitor for hypoglycemia and hyperglycemia and adjust insulin regimen as needed  Insulin resistance likely due to overweight status   Recommend nutrition consult for diabetic diet education  When cleared by primary team/GI, would recommend starting metformin.  BG goal in hospital: 140 to 180 mg/dl  Discharge recommendations as below      Endocrine Recommendations       Home regimen prior to hospital presentation   None             Please discharge on this   regimen   Please use Diabetes discharge order set to prescribe the following   (allow for medication substitutions based on patient's insurance coverage)     TBD prior to hospital discharge, but likely:    Metformin 500mg  orally BID x 1 week, then increase to 1000mg  orally BID    Glucomter/test strips/lancets       Follow up provider  PCP, Pcp, None, MD           Henry Russel, MD, thank you for this consultation.  We will follow the patient with you during this hospitalization.  Please contact me with any questions or issues.    Case discussed with Dr. Carlynn Purl, ACNP-BC  [facility]  Department of Medicine, Diabetes & Endocrine  Spectra [phone] chat   If unavailable, please page [phone] endocrine or Epic chat group IP Endocrinology      History of Presenting Illness:   [name] with past medical history of obesity, hepatic steatosis and HLD who presented to the hospital on 04/09/2023 with gastric pain and hyperglycemia, found to have gastritis and undiagnosed  diabetes (hgba1c 9/5%).  Endocrinology consulted for management of newly diagnosed diabetes.    Per patient, he has never been told that he was diabetic or that he had elevated BG.  His last visit to the doctor was 2-3 years ago and at that time he was told he had high cholesterol, but no other issues. He does not have [name] PCP at this time and was never directed to check his BG . Pt was feeling in his normal state of health until this past Friday when he started having abdominal discomfort.  Today he is feeling better with medication and is eating, though admits to continued abdominal discomfort.    Pt denies neuropathy, N/V, or [name] history of chronic UTIs or pancreatitis.  He endorses [name] good appetite, stable weight, blurred vision (never seen an ophthalmologist), and polyuria (3-4x night).  Family history is significant for diabetes in his mother and aunt.     Past Medical History:     Past Medical History:   Diagnosis Date    HLD (hyperlipidemia)        Past Surgical History:   History reviewed. No pertinent surgical history.    Social History:   Patient  reports that he has never smoked. He has never used smokeless tobacco. He reports that he does not currently use alcohol. He reports that he does not use drugs.    Family History:   Patient reports family history includes Diabetes in his maternal aunt and mother.     Allergies:   No Known Allergies    Medications:     No medications prior to admission.       Current Facility-Administered Medications   Medication Dose Route Frequency    amLODIPine  10 mg Oral Daily    enoxaparin  40 mg Subcutaneous Daily    insulin glargine  20 Units Subcutaneous QAM    Or    insulin glargine  10 Units Subcutaneous QAM    insulin lispro  1-3 Units Subcutaneous QHS    insulin lispro  1-5 Units Subcutaneous TID AC    lisinopril  10 mg Oral Once    [START ON 04/12/2023] lisinopril  20 mg Oral Daily    morphine  1 mg Intravenous Once    pantoprazole  40 mg Intravenous BID    polyethylene  glycol  17 g Oral Daily    senna-docusate  1 tablet Oral BID        Available old records reviewed, including:  EPIC  Review of Systems:   All systems were reviewed and are negative except as per HPI    Physical Exam:   Temp:  [97.7 F (36.5 C)-99 F (37.2 C)] 98.6  F (37 C)  Heart Rate:  [74-95] 95  Resp Rate:  [15-19] 18  BP: (147-188)/(87-119) 152/92   Wt Readings from Last 3 Encounters:   04/09/23 93 kg (205 lb)   11/30/11 86.2 kg (190 lb)     Body mass index is 32.11 kg/m.    Intake/Output Summary (Last 24 hours) at 04/11/2023 1625  Last data filed at 04/11/2023 0900  Gross per 24 hour   Intake 250 ml   Output --   Net 250 ml       Physical Exam:   General: well-developed, alert, NAD  HEENT: conjunctiva pale, buccal mucosa moist, anicteric sclera, no erythema   Lungs: CTA B/L, no rhonchi, wheezes or crackles with normal rate and effort  Cardiovascular:  S1, S2, normal rate, regular rhythm, no m/r/g,   Abdominal: soft, NT/ND, +B.S.  Extremities: warm to touch, no cyanosis   Neuromuscular exam: alert and oriented x 3, no gross motor deficits, speech fluent   Skin: no rashes or lesions noted on exposed surfaces    Labs:     Whole Blood Glucose POCT   Date/Time Value Ref Range Status   04/11/2023 1127 241 (H) 70 - 100 mg/dL Final   16/09/9603 5409 238 (H) 70 - 100 mg/dL Final       Hemoglobin A1C (%)   Date Value   04/10/2023 9.5 (H)       Recent Labs     04/11/23  0256 04/09/23  2154   WBC 7.93 8.48   Hgb 16.5 18.0*   Hematocrit 45.4 49.2   Platelets 210 229       Recent Labs     04/11/23  0256 04/09/23  2154   Sodium 134* 136   Potassium 3.4* 3.3*   Chloride 99 100   CO2 24 25   BUN 14.0 15.0   Creatinine 0.9 1.0   Glucose 232* 233*   Calcium 8.6 9.1       Recent Labs     04/09/23  2154   AST (SGOT) 46*   ALT 124*   Alkaline Phosphatase 114   Protein, Total 8.3   Albumin 4.3       No results for input(s): "PTT", "PT", "INR" in the last 72 hours.    No results found for: "TSH", "T4FREE", "T4", "FREET3",  "T3"      Signed by: Arman Bogus, NP    cc: Henry Russel, MD  Pcp, None, MD

## 2023-04-11 NOTE — Progress Notes (Signed)
[facility] [employer] (previously [facility]):     Received a referral to schedule a follow up appointment with the [facility] for [employer].  Appointment scheduled for Tuesday 04/24/23 at 10:20 am with Dr. Arville Care at [place] location.      Clinic address is as follows:    [address] [place], [address]    Please notify patient to arrive 15 minutes early to the appointment and bring the following materials with them:    Insurance card (if insured) and photo ID  Medications in their original bottles  Glucometer/blood sugar log (if diabetic)  Weight log (if heart failure)  Proof of income (to enroll in medication assistance programs-first two pages of signed 1040 tax forms or last 2 months of pay stubs)      Dawayne Patricia  Patient Access Associate II  [facility] for [employer]   [phone]

## 2023-04-11 NOTE — Plan of Care (Signed)
Problem: Pain interferes with ability to perform ADL  Goal: Pain at adequate level as identified by patient  04/11/2023 0356 by Richardson Landry, RN  Outcome: Progressing  Flowsheets (Taken 04/11/2023 0356)  Pain at adequate level as identified by patient:   Identify patient comfort function goal   Assess for risk of opioid induced respiratory depression, including snoring/sleep apnea. Alert healthcare team of risk factors identified.   Assess pain on admission, during daily assessment and/or before any "as needed" intervention(s)   Evaluate if patient comfort function goal is met   Offer non-pharmacological pain management interventions   Include patient/patient care companion in decisions related to pain management as needed  04/11/2023 0356 by Richardson Landry, RN  Outcome: Progressing     Problem: Side Effects from Pain Analgesia  Goal: Patient will experience minimal side effects of analgesic therapy  04/11/2023 0356 by Richardson Landry, RN  Outcome: Progressing  Flowsheets (Taken 04/11/2023 [phone])  Patient will experience minimal side effects of analgesic therapy:   Prevent/manage side effects per LIP orders (i.e. nausea, vomiting, pruritus, constipation, urinary retention, etc.)   Monitor/assess patient's respiratory status (RR depth, effort, breath sounds)   Evaluate for opioid-induced sedation with appropriate assessment tool (i.e. POSS)  04/11/2023 0356 by Richardson Landry, RN  Outcome: Progressing     Problem: Hemodynamic Status: Cardiac  Goal: Stable vital signs and fluid balance  04/11/2023 0356 by Richardson Landry, RN  Outcome: Progressing  Flowsheets (Taken 04/11/2023 0356)  Stable vital signs and fluid balance:   Assess signs and symptoms associated with cardiac rhythm changes   Monitor lab values  04/11/2023 0356 by Richardson Landry, RN  Outcome: Progressing     Problem: Inadequate Tissue Perfusion  Goal: Adequate tissue perfusion will be maintained  04/11/2023 0356 by  Richardson Landry, RN  Outcome: Progressing  Flowsheets (Taken 04/11/2023 0356)  Adequate tissue perfusion will be maintained:   Monitor/assess lab values and report abnormal values   Monitor/assess neurovascular status (pulses, capillary refill, pain, paresthesia, paralysis, presence of edema)   Monitor for signs and symptoms of a pulmonary embolism (dyspnea, tachypnea, tachycardia, confusion)   Encourage/assist patient as needed to turn, cough, and perform deep breathing every 2 hours  04/11/2023 0356 by Richardson Landry, RN  Outcome: Progressing     Problem: Ineffective Gas Exchange  Goal: Effective breathing pattern  04/11/2023 0356 by Richardson Landry, RN  Outcome: Progressing  Flowsheets (Taken 04/11/2023 0356)  Effective breathing pattern: Teach/reinforce use of ordered respiratory interventions (ie. CPAP, BiPAP, Incentive Spirometer, Acapella)  04/11/2023 0356 by Richardson Landry, RN  Outcome: Progressing     Problem: Altered GI Function  Goal: Fluid and electrolyte balance are achieved/maintained  04/11/2023 0356 by Richardson Landry, RN  Outcome: Progressing  Flowsheets (Taken 04/11/2023 0356)  Fluid and electrolyte balance are achieved/maintained:   Monitor/assess lab values and report abnormal values   Assess and reassess fluid and electrolyte status   Monitor for muscle weakness  04/11/2023 0356 by Richardson Landry, RN  Outcome: Progressing  Goal: Elimination patterns are normal or improving  04/11/2023 0356 by Richardson Landry, RN  Outcome: Progressing  Flowsheets (Taken 04/11/2023 0356)  Elimination patterns are normal or improving: Assess for and discuss C. diff screening with LIP  04/11/2023 0356 by Richardson Landry, RN  Outcome: Progressing

## 2023-04-11 NOTE — Progress Notes (Signed)
MEDICINE PROGRESS NOTE    Date Time: 04/11/23 4:46 PM  Patient Name: Mark [name]  Attending Physician: Henry Russel, MD     Assessment & Plan:     [phone] who no PMH presents with 3 days of severe epigastric pain.  Also found to have new diagnosis of diabetes and with high BP.    Assessment:  #Severe abdominal pain, epigastric, suspect severe gastritis vs PUD  #HTN urgency  #Hepatic steatosis  #DM, new diagnosis, A1c 9.5%  #Obesity BMI 32    Plan:  - GI consulted given no relief with GI cocktail. Plan for EGD in AM.   - NPO at MN  - IV PPI, [place] carafate  - Avoid NSAIDs  - Endocrinology consulted and nutrition. C/w lantus/LISS, likely [place] on metformin  - Start lisinopril and amlodipine for BP, uptitrate PRN; will need Rx on [place]  - Case manager consult/[facility] for post [place] follow up. Pt is uninsured and has no PCP      VTE ppx: lovenox  Foley: no  IVs: piv  PT/OT: no    Subjective      Pt seen with Spanish interpreter. He continues to have severe epigastric pain after eating. His mom  has diabetes but he did not know that he has it.       Physical Exam:     VITAL SIGNS  Temp:  [97.7 F (36.5 C)-99 F (37.2 C)] 98.6 F (37 C)  Heart Rate:  [74-95] 95  Resp Rate:  [15-19] 18  BP: (147-188)/(87-119) 152/92      Intake/Output Summary (Last 24 hours) at 04/11/2023 1646  Last data filed at 04/11/2023 0900  Gross per 24 hour   Intake 250 ml   Output --   Net 250 ml      PHYSICAL EXAM  General: awake, alert, oriented x 3; no acute distress.  Cardiovascular: regular rate and rhythm, no murmurs, rubs or gallops  Lungs: clear to auscultation bilaterally, without wheezing, rhonchi, or rales  Abdomen: soft, non-tender, non-distended; no palpable masses, no hepatosplenomegaly, normoactive bowel sounds, no rebound or guarding  Extremities: no clubbing, cyanosis, or edema       Meds:     Medications were reviewed:  Scheduled Meds:     amLODIPine, 10 mg, Oral, Daily  enoxaparin, 40 mg, Subcutaneous, Daily  insulin  glargine, 20 Units, Subcutaneous, QAM   Or  insulin glargine, 10 Units, Subcutaneous, QAM  insulin lispro, 1-3 Units, Subcutaneous, QHS  insulin lispro, 1-5 Units, Subcutaneous, TID AC  lisinopril, 10 mg, Oral, Once  [START ON 04/12/2023] lisinopril, 20 mg, Oral, Daily  morphine, 1 mg, Intravenous, Once  pantoprazole, 40 mg, Intravenous, BID  polyethylene glycol, 17 g, Oral, Daily  senna-docusate, 1 tablet, Oral, BID        Continuous Infusions:        PRN Meds:    acetaminophen, 650 mg, TID PRN  alum & mag hydroxide-simethicone, 30 mL, Q4H PRN  benzocaine-menthol, 1 lozenge, Q2H PRN  benzonatate, 100 mg, TID PRN  carboxymethylcellulose sodium, 1 drop, TID PRN  dextrose, 15 g of glucose, PRN   Or  dextrose, 12.5 g, PRN   Or  dextrose, 12.5 g, PRN   Or  glucagon (rDNA), 1 mg, PRN  hydrALAZINE, 10 mg, Q6H PRN  magnesium sulfate, 1 g, PRN  melatonin, 3 mg, QHS PRN  naloxone, 0.2 mg, PRN  potassium & sodium phosphates, 2 packet, PRN  potassium chloride, 0-40 mEq, PRN   And  potassium chloride, 10 mEq, PRN  saline, 2 spray, Q4H PRN        Labs:     Labs (last 72 hours):    Recent Labs   Lab 04/11/23  0256 04/09/23  2154   WBC 7.93 8.48   Hgb 16.5 18.0*   Hematocrit 45.4 49.2   Platelets 210 229          Recent Labs   Lab 04/11/23  0256 04/09/23  2154   Sodium 134* 136   Potassium 3.4* 3.3*   Chloride 99 100   CO2 24 25   BUN 14.0 15.0   Creatinine 0.9 1.0   Calcium 8.6 9.1   Albumin  --  4.3   Protein, Total  --  8.3   Bilirubin, Total  --  1.1   Alkaline Phosphatase  --  114   ALT  --  124*   AST (SGOT)  --  46*   Glucose 232* 233*                   Microbiology, reviewed and are significant for:  As noted above.    Imaging, reviewed and are significant for:  As noted above.      Signed by:   Henry Russel, MD  [facility]

## 2023-04-11 NOTE — Nursing Progress Note (Signed)
Medicine Shift Note    Braden Scale Score: 22 (04/10/23 1945)  Skin Integrity: Scars  Patient Lines/Drains/Airways Status       Active Lines, Drains and Airways       Name Placement date Placement time Site Days    Peripheral IV 04/09/23 20 G Standard Left Antecubital 04/09/23  2226  Antecubital  1                      Skin  4 eyes in 4 hours pressure injury assessment note:       Completed with:  N/A           Bony Prominences: Check appropriate box; if wound is present enter wound assessment in LDA      Occiput:                 [x] WNL  []  Wound present  Face:                     [x] WNL  []  Wound present  Ears:                      [x] WNL  []  Wound present  Spine:                    [x] WNL  []  Wound present  Shoulders:             [x] WNL  []  Wound present  Elbows:                  [x] WNL  []  Wound present  Sacrum/coccyx:     [x] WNL  []  Wound present  Ischial Tuberosity:  [x] WNL  []  Wound present  Trochanter/Hip:      [x] WNL  []  Wound present  Knees:                   [x] WNL  []  Wound present  Ankles:                   [x] WNL  []  Wound present  Heels:                    [x] WNL  []  Wound present  Other pressure areas:  []  Wound location    If wound is present in any of the following from above, please describe findings below: No wounds assessed this shift          Device related: []  Device name:         LDA completed if wound present: N/A    Consult WOCN: No needs    Other skin related issues, ie tears, rash, etc, document in Integumentary flowsheet  - see flowsheet     Last BM: 4/22; per patient patient had poor appetite prior to admission     Pending Orders: None     Discharge Plan: TBD     POC: Patient     Tele: NSR    Activity: Independent     PT/OT:  No needs     Interpreter Needs: No needs; patient prefers English     Intake & Output for the shift: No intake/output data recorded.    Shift Note: Patient is alert and oriented x4. Patient has episodes of persistent hypertension this shift (170s-180s/100-110s).  Patient given prn hydralazine once at 2038 with minimal result and night hospitalist Dr. Gwen Her made aware. Ordered x1 dose of IV labetalol. Patient placed on monitor  and assessed per protocol. Tolerated well with moderate result (160s/90-100s) See flowsheet.   Patient DBP continues to be >90 and MD made aware with no new orders placed.   Around 0340 patient BP was again elevated at 183/108. Given another dose of IV hydralazine. Repeat BP 159/99. Patient intermittently c/o headache with elevated BP which resolved shortly after antihypertensives were administered. Declined further intervention.   Patient also c/o GI discomfort and bloating. Given prn Maalox as ordered by Hospitalist with good result. Otherwise medicated per MAR and tolerated well.  AM potassium resulted 3.4. replaced with prn protocol. PO tabs given and 2 bags K-riders given and tolerated well.   Safety precautions in place. Call bell within reach.

## 2023-04-11 NOTE — UM Notes (Addendum)
04/11/23 1321  Admit to Inpatient  Once         04/11/23 1321     Inpatient review  Unit: Medicine    OBS admit: April 10, 2023  OBS to Inpatient upgrade requiring > 2 MN beyond OBS level of care    47 year old male with no known past medical history who was admitted this morning for severe epigastric abdominal pain ongoing for 3 days prior to admission. Unknown etiology at this time but possibly due to gastritis     BP (!) 167/95   Pulse 82   Temp 97.9 F (36.6 C) (Oral)   Resp 17   Ht 1.702 m (5\' 7" )   Wt 93 kg (205 lb)   SpO2 95%   BMI 32.11 kg/m     Labs: glucose 232, Na 134, K 3.4, Triglycerides 363     Current Facility-Administered Medications   Medication Dose Route Frequency    amLODIPine  10 mg Oral Daily    enoxaparin  40 mg Subcutaneous Daily    insulin glargine  20 Units Subcutaneous QAM    Or    insulin glargine  10 Units Subcutaneous QAM    insulin lispro  1-3 Units Subcutaneous QHS    insulin lispro  1-5 Units Subcutaneous TID AC    lisinopril  10 mg Oral Daily    morphine  1 mg Intravenous Once    pantoprazole  40 mg Intravenous BID    polyethylene glycol  17 g Oral Daily    senna-docusate  1 tablet Oral BID    sucralfate  1 g Oral TID AC & HS     Continuous Infusions:  PRN Meds:.acetaminophen, alum & mag hydroxide-simethicone, benzocaine-menthol, benzonatate, carboxymethylcellulose sodium, dextrose **OR** dextrose **OR** dextrose **OR** glucagon (rDNA), hydrALAZINE, magnesium sulfate, melatonin, naloxone, potassium & sodium phosphates, potassium chloride **AND** potassium chloride, saline    Hydralazine 10 mg IV X 1  K lor 20 mEq po X 1  Potassium chloride 10 mEq iv X 2    Per GI:  #epigastric pain   Suspect symptoms due to PUD (?h.pylori) vs gastritis/esophagitis. Consider gastroparesis given new dx DM, although denies n/v and early satiety. If EGD is negative would consider this as etiology of symptoms.   - CT A/P negative  - no anemia or concern for overt GI bleeding   #new dx DM  - hgb  A1c 9.5%  Recommendations  - continue PPI IV BID  - stop carafate as it can make visualization during EGD challenging  - plan for EGD tentatively tomorrow, 4/25  - NPO midnight  - hold AM DVT ppx  - ensure has AM labs    Pollyann Kennedy, RN, BSN CNRN  UR Case Manager   Utilization Review  [facility]   [address]  [address]  [place], [place] [address]  Phone : [phone]   [place] [phone]  Fax: [phone]    [email or web address]

## 2023-04-11 NOTE — Plan of Care (Signed)
Problem: Pain interferes with ability to perform ADL  Goal: Pain at adequate level as identified by patient  Outcome: Progressing  Flowsheets (Taken 04/11/2023 1117)  Pain at adequate level as identified by patient:   Identify patient comfort function goal   Assess for risk of opioid induced respiratory depression, including snoring/sleep apnea. Alert healthcare team of risk factors identified.   Assess pain on admission, during daily assessment and/or before any "as needed" intervention(s)   Reassess pain within 30-60 minutes of any procedure/intervention, per Pain Assessment, Intervention, Reassessment (AIR) Cycle   Evaluate if patient comfort function goal is met   Evaluate patient's satisfaction with pain management progress   Offer non-pharmacological pain management interventions     Problem: Side Effects from Pain Analgesia  Goal: Patient will experience minimal side effects of analgesic therapy  Outcome: Progressing  Flowsheets (Taken 04/11/2023 1117)  Patient will experience minimal side effects of analgesic therapy:   Monitor/assess patient's respiratory status (RR depth, effort, breath sounds)   Assess for changes in cognitive function   Prevent/manage side effects per LIP orders (i.e. nausea, vomiting, pruritus, constipation, urinary retention, etc.)   Evaluate for opioid-induced sedation with appropriate assessment tool (i.e. POSS)     Problem: Hemodynamic Status: Cardiac  Goal: Stable vital signs and fluid balance  Outcome: Progressing  Flowsheets (Taken 04/11/2023 1117)  Stable vital signs and fluid balance:   Assess signs and symptoms associated with cardiac rhythm changes   Monitor lab values     Problem: Inadequate Tissue Perfusion  Goal: Adequate tissue perfusion will be maintained  Outcome: Progressing  Flowsheets (Taken 04/11/2023 1117)  Adequate tissue perfusion will be maintained:   Monitor/assess lab values and report abnormal values   Monitor/assess neurovascular status (pulses, capillary  refill, pain, paresthesia, paralysis, presence of edema)   Monitor/assess for signs of VTE (edema of calf/thigh redness, pain)   Monitor for signs and symptoms of a pulmonary embolism (dyspnea, tachypnea, tachycardia, confusion)   Encourage/assist patient as needed to turn, cough, and perform deep breathing every 2 hours     Problem: Altered GI Function  Goal: Fluid and electrolyte balance are achieved/maintained  Outcome: Progressing  Flowsheets (Taken 04/11/2023 1117)  Fluid and electrolyte balance are achieved/maintained:   Monitor/assess lab values and report abnormal values   Assess and reassess fluid and electrolyte status   Observe for cardiac arrhythmias  Goal: Elimination patterns are normal or improving  Outcome: Progressing  Flowsheets (Taken 04/11/2023 1117)  Elimination patterns are normal or improving: Assess for and discuss C. diff screening with LIP

## 2023-04-12 ENCOUNTER — Inpatient Hospital Stay: Payer: Self-pay | Admitting: Anesthesiology

## 2023-04-12 ENCOUNTER — Encounter
Admission: EM | Disposition: A | Discharge status: Home / Self Care | Payer: Self-pay | Source: Home / Self Care | Attending: Internal Medicine

## 2023-04-12 ENCOUNTER — Ambulatory Visit: Payer: Self-pay

## 2023-04-12 DIAGNOSIS — E669 Obesity, unspecified: Secondary | ICD-10-CM

## 2023-04-12 DIAGNOSIS — Z794 Long term (current) use of insulin: Secondary | ICD-10-CM

## 2023-04-12 DIAGNOSIS — E1165 Type 2 diabetes mellitus with hyperglycemia: Secondary | ICD-10-CM

## 2023-04-12 DIAGNOSIS — R1013 Epigastric pain: Secondary | ICD-10-CM

## 2023-04-12 DIAGNOSIS — K2900 Acute gastritis without bleeding: Secondary | ICD-10-CM

## 2023-04-12 HISTORY — PX: ESOPHAGOGASTRODUODENOSCOPY (EGD), BIOPSY: SHX3796

## 2023-04-12 LAB — BASIC METABOLIC PANEL
Anion Gap: 10 (ref 5.0–15.0)
BUN: 16 mg/dL (ref 9.0–28.0)
CO2: 21 mEq/L (ref 17–29)
Calcium: 8.8 mg/dL (ref 8.5–10.5)
Chloride: 105 mEq/L (ref 99–111)
Creatinine: 0.9 mg/dL (ref 0.5–1.5)
Glucose: 183 mg/dL — ABNORMAL HIGH (ref 70–100)
Potassium: 3.9 mEq/L (ref 3.5–5.3)
Sodium: 136 mEq/L (ref 135–145)
eGFR: >60 mL/min/{1.73_m2} (ref >=60)

## 2023-04-12 LAB — CBC
Absolute NRBC: 0 10*3/uL (ref 0.00–0.00)
Hematocrit: 47.4 % (ref 37.6–49.6)
Hgb: 16.8 g/dL (ref 12.5–17.1)
MCH: 30.7 pg (ref 25.1–33.5)
MCHC: 35.4 g/dL (ref 31.5–35.8)
MCV: 86.5 fL (ref 78.0–96.0)
MPV: 11.8 fL (ref 8.9–12.5)
Nucleated RBC: 0 /100 WBC (ref 0.0–0.0)
Platelets: 211 10*3/uL (ref 142–346)
RBC: 5.48 10*6/uL (ref 4.20–5.90)
RDW: 12 % (ref 11–15)
WBC: 7.47 10*3/uL (ref 3.10–9.50)

## 2023-04-12 LAB — WHOLE BLOOD GLUCOSE POCT
Whole Blood Glucose POCT: 188 mg/dL — ABNORMAL HIGH (ref 70–100)
Whole Blood Glucose POCT: 151 mg/dL — ABNORMAL HIGH (ref 70–100)
Whole Blood Glucose POCT: 212 mg/dL — ABNORMAL HIGH (ref 70–100)

## 2023-04-12 SURGERY — ESOPHAGOGASTRODUODENOSCOPY (EGD), BIOPSY
Anesthesia: Anesthesia General

## 2023-04-12 MED ORDER — LACTATED RINGERS IV SOLN
INTRAVENOUS | Status: DC
Start: 2023-04-12 — End: 2023-04-12

## 2023-04-12 MED ORDER — PROPOFOL 10 MG/ML IV EMUL (WRAP)
INTRAVENOUS | Status: DC | PRN
Start: 2023-04-12 — End: 2023-04-12
  Administered 2023-04-12: 150 mg via INTRAVENOUS
  Administered 2023-04-12 (×2): 30 mg via INTRAVENOUS

## 2023-04-12 MED ORDER — PANTOPRAZOLE SODIUM 40 MG PO TBEC
40.0000 mg | DELAYED_RELEASE_TABLET | Freq: Every day | ORAL | 0 refills | Status: AC
Start: 2023-04-12 — End: 2023-05-12

## 2023-04-12 MED ORDER — LISINOPRIL 20 MG PO TABS
20.0000 mg | ORAL_TABLET | Freq: Every day | ORAL | 0 refills | Status: AC
Start: 2023-04-12 — End: ?

## 2023-04-12 MED ORDER — GLIPIZIDE ER 5 MG PO TB24
5.0000 mg | ORAL_TABLET | Freq: Every day | ORAL | 0 refills | Status: AC
Start: 2023-04-12 — End: ?

## 2023-04-12 MED ORDER — METFORMIN HCL 500 MG PO TABS
500.0000 mg | ORAL_TABLET | Freq: Two times a day (BID) | ORAL | 0 refills | Status: AC
Start: 2023-04-12 — End: ?

## 2023-04-12 MED ORDER — METFORMIN HCL 500 MG PO TABS
500.0000 mg | ORAL_TABLET | Freq: Every day | ORAL | Status: DC
Start: 2023-04-12 — End: 2023-04-12
  Administered 2023-04-12: 500 mg via ORAL
  Filled 2023-04-12: qty 1

## 2023-04-12 MED ORDER — AMLODIPINE BESYLATE 10 MG PO TABS
10.0000 mg | ORAL_TABLET | Freq: Every day | ORAL | 0 refills | Status: AC
Start: 2023-04-12 — End: ?

## 2023-04-12 MED ORDER — GLYCOPYRROLATE 0.2 MG/ML IJ SOLN (WRAP)
INTRAMUSCULAR | Status: DC | PRN
Start: 2023-04-12 — End: 2023-04-12
  Administered 2023-04-12: 200 mg via INTRAVENOUS

## 2023-04-12 MED ORDER — INSULIN GLARGINE 100 UNIT/ML SC SOLN
5.0000 [IU] | SUBCUTANEOUS | Status: DC
Start: 2023-04-13 — End: 2023-04-12

## 2023-04-12 MED ORDER — INSULIN GLARGINE 100 UNIT/ML SC SOLN
10.0000 [IU] | SUBCUTANEOUS | Status: DC
Start: 2023-04-13 — End: 2023-04-12

## 2023-04-12 SURGICAL SUPPLY — 24 items
BLOCK BITE OD60 FR STURDY STRAP SIDEPORT (Procedure Accessories) ×1 IMPLANT
BLOCK BITE OD60 FR STURDY STRAP SIDEPORT DENTAL RETENTION RIM MAXI (Procedure Accessories) ×1 IMPLANT
CONNECTOR IRRIGATION AUXILIARY WATER JET (Connector) ×1 IMPLANT
CONNECTOR IRRIGATION AUXILIARY WATER JET 1 WAY VALVE HYDRA (Connector) ×1 IMPLANT
CONTAINER HISTOLOGY 60 ML 30 ML GRADUATE LEAK RESISTANT O RING PREFILL (Procedure Accessories) ×1 IMPLANT
FORCEPS BIOPSY L240 CM LARGE CAPACITY (Procedure Accessories) IMPLANT
FORCEPS BIOPSY L240 CM LARGE CAPACITY MICROMESH TEETH STREAMLINE (Procedure Accessories) IMPLANT
FORCEPS BIOPSY L240 CM STANDARD CAPACITY (Disposable Instruments) ×1 IMPLANT
FORCEPS BIOPSY L240 CM STANDARD CAPACITY NEEDLE OD2.2 MM RADIAL JAW (Disposable Instruments) ×1 IMPLANT
GLOVE EXAM LARGE NITRILE CHEMOTHERAPY POWDER FREE SENSE OATMEAL (Glove) ×1 IMPLANT
GLOVE EXAM NITRILE RESTORE LG (Glove) ×1 IMPLANT
GLV EXAM NITRILE RESTORE LG (Glove) ×1
GOWN ISL PP PE REG LG LF FULL BCK NK TIE (Gown) ×1 IMPLANT
GOWN ISOLATION REGULAR LARGE FULL BACK NECK TIE ELASTIC CUFF (Gown) ×1 IMPLANT
KIT ENDOSCOPIC COMPLIANCE ENDOKIT (Kits) ×1 IMPLANT
KIT ENDOSCOPIC COMPLIANCE ENDOKIT ORCAPOD 3 1.1 OZ (Kits) ×1 IMPLANT
MANIFOLD SUCTION 2 STANDARD 4 PORT (Filter) ×2 IMPLANT
MANIFOLD SUCTION 2 STANDARD 4 PORT NEPTUNE 2 WASTE MANAGEMENT SYSTEM (Filter) ×2 IMPLANT
SOL FORMALIN 10% PREFILL 30ML (Procedure Accessories) ×1 IMPLANT
SYRINGE 50 ML GRADUATE NONPYROGENIC DEHP (Syringes, Needles) ×1 IMPLANT
SYRINGE 50 ML GRADUATE NONPYROGENIC DEHP FREE PVC FREE BD MEDICAL (Syringes, Needles) ×1 IMPLANT
TUBING SUCTION OD3/16 IN L12 FT FEMALE (Tubing) ×1 IMPLANT
TUBING SUCTION OD3/16 IN L12 FT FEMALE CONNECTOR RIBBED UNIVERSAL (Tubing) ×1 IMPLANT
WATER STERILE PVC FREE DEHP FREE 1000 ML (Solution) ×1 IMPLANT

## 2023-04-12 NOTE — Consults (Signed)
Nutrition - DM diet education consult     Please see nutrition consult from 04/11/2023. Verbal and written instruction provided.     Waunita Schooner, RDN

## 2023-04-12 NOTE — Anesthesia Preprocedure Evaluation (Signed)
Anesthesia Evaluation    AIRWAY    Mallampati: II    TM distance: >3 FB  Neck ROM: full  Mouth Opening:full   CARDIOVASCULAR    cardiovascular exam normal, regular and normal       DENTAL    no notable dental hx               PULMONARY    pulmonary exam normal     OTHER FINDINGS                                    Relevant Problems   No relevant active problems               Anesthesia Plan    ASA 3     general                     intravenous induction   Detailed anesthesia plan: general IV        Post op pain management: per surgeon    informed consent obtained    Plan discussed with CRNA.                 Signed by: Saintclair Halsted, MD 04/12/23 12:30 PM

## 2023-04-12 NOTE — Progress Notes (Addendum)
Systems Case Management Progress Note:    Patient: Mark [name]    Admission Date: 04/12/23 3:26 PM    Active Hospital Problems    Diagnosis    Acute gastritis without hemorrhage, unspecified gastritis type    Epigastric pain        Length of stay: 1  ?  Disposition:   Discharge plan:     Type of Services Provider Name   Provider Phone Number   Length of Need SCM approved by:  Comments:   Skilled Nursing Facility "SNF"        Assisted Living        LTAC        Dialysis        Home Health        Infusion        DME        Medications   $ 59.78 Carolynn Serve, CM manager    Transportation        Non-skilled Care ie Private Duty Aide          Diabetic starter kit provided to pt at bedside. ICCB order in for follow up appointment.    Francee Piccolo  RN Case Manager I

## 2023-04-12 NOTE — Progress Notes (Signed)
CMU Discharge Note    Discharge to: home    Family present during discharge: n/a    Belonging: taken with pt    Discharge education to: patient    Discharging equipments: medical equipment    Discharging medication: Delivered at bedside    IV: removed    Discharged Via:  wheelchair by: staff    Discharge Note:  Patient alert and oriented x4 during shift, patient VSS, patient on RA, patient denied pain and discomfort. Patient maintained NPO status for EGD procedure. Patient received all meds per [facility] and tolerated well. Safety precautions placed and call light within reach. Discharge paperwork printed and d/c instructions taught. Patient showed understanding and all questions answered.

## 2023-04-12 NOTE — Progress Notes (Signed)
04/12/23 1454   CMA Tasks   CMA tasks Other (with free text comment)     Diabetic kit delivered

## 2023-04-12 NOTE — H&P (Signed)
[  x] Patient seen prior to planned EGD  [x]  I confirmed key history components and performed focused examination.    [x]  Reviewed Laboratory data and Imaging.  [x]  The Indication for planned endoscopic procedure is appropriate.    CLINICAL DIAGNOSIS: Epigastric pain    INFORMED CONSENT:  [x]  Patient is able to consent         [x]  Informed about benefits of the procedure.  [x]  Discussed rare risks of bleeding, perforation and missed lesions.   [x]  Rare need for emergent surgery and repeat procedures was addressed.   [x]  Opportunity to ask questions was given.   [x]  Shared informed consent was obtained.    BRIEF PHYSICAL EXAMINATION:    Vitals:    04/12/23 1200   BP: (!) 154/91   Pulse: 76   Resp: 18   Temp: 98.2 F (36.8 C)   SpO2: 95%      General: Alert and cooperative  Lungs: Lungs clear to auscultation  Cardiac: RRR, normal S1S2.    Abdomen: Soft, non tender. Normal active bowel sounds  Neurological: No focal weakness    No Known Allergies    Past Medical History:   Diagnosis Date    HLD (hyperlipidemia)        History reviewed. No pertinent surgical history.      Lab Results   Component Value Date/Time    HCT 47.4 04/12/2023 05:47 AM    PLT 211 04/12/2023 05:47 AM    WBC 7.47 04/12/2023 05:47 AM      Lab Results   Component Value Date/Time    BUN 16.0 04/12/2023 05:47 AM    CREAT 0.9 04/12/2023 05:47 AM    NA 136 04/12/2023 05:47 AM    K 3.9 04/12/2023 05:47 AM      Lab Results   Component Value Date/Time    BILITOTAL 1.1 04/09/2023 09:54 PM    AST 46 (H) 04/09/2023 09:54 PM    ALT 124 (H) 04/09/2023 09:54 PM    ALKPHOS 114 04/09/2023 09:54 PM    ALB 4.3 04/09/2023 09:54 PM      No results found for: "PT", "INR"

## 2023-04-12 NOTE — Transfer of Care (Signed)
Anesthesia Transfer of Care Note    Patient: Mark [name]    Procedures performed: Procedure(s):  EGD, BIOPSY    Anesthesia type: General TIVA    Patient location:Phase I PACU    Last vitals:   Vitals:    04/12/23 1200   BP: (!) 154/91   Pulse: 76   Resp: 18   Temp: 36.8 C (98.2 F)   SpO2: 95%       Post pain: Patient not complaining of pain, continue current therapy      Mental Status:sedated    Respiratory Function: tolerating room air    Cardiovascular: stable    Nausea/Vomiting: patient not complaining of nausea or vomiting    Hydration Status: adequate    Post assessment: no apparent anesthetic complications    Signed by: Glenna Fellows, MD  04/12/23 1:22 PM

## 2023-04-12 NOTE — Plan of Care (Signed)
Gastroenterology    PLAN OF CARE NOTE  Gastroenterology Consult Service - [facility]  [facility] (Group): [facility]  [address] [place], [place] [address]  Appointments: [phone] or [phone] for GI call center    S/p EGD (4/25): mild antral gastritis, biopsies taken    Recommendations  - pantoprazole 40mg  daily x 4 weeks  - regular diet  - outpatient GI follow up in 6 weeks. Message sent to navigators.     GI will go on standby at this time. Please do not hesitate to re-consult with any further questions or changes in clinical status.     Discussed with Dr. Allena Napoleon.     Magdalen Spatz, PA-C

## 2023-04-12 NOTE — Discharge Summary (Signed)
Discharge Summary    Date:04/12/2023   Patient Name: Mark [name]  Attending Physician: Mark Russel, MD    Date of Admission:   04/09/2023    Date of Discharge:   04/12/23      Admitting Diagnosis/reason for admission:   Acute gastritis without hemorrhage, unspecified gastritis type        Discharge Dx:     Principal Diagnosis (Diagnosis after study, that is chiefly responsible for admission to inpatient status):    #Severe abdominal pain, epigastric, 2/2 gastritis  #HTN urgency  #Hepatic steatosis  #DM, new diagnosis, A1c 9.5%  #Obesity BMI 32      Treatment Team:   Treatment Team:   Attending Provider: Henry Russel, MD  Consulting Physician: Mark Canterbury, MD     Procedures performed:   Radiology: all results from this admission  US Abdomen Limited RUQ    Result Date: 04/10/2023   Hepatic steatosis. Mark Kindred, MD 04/10/2023 3:11 AM    CT Abd/Pelvis with IV Contrast only    Result Date: 04/10/2023  No acute abnormality. Nonacute findings detailed above. Mark Kindred, MD 04/10/2023 12:21 AM     Remainder as below.    Hospital Course:       47yoM who no PMH presents with 3 days of severe epigastric pain.  Also found to have new diagnosis of diabetes and with high BP.    He underwent EGD with GI on 4/25 which showed antral gastritis, and biopsies were taken.  He felt improve with PPI and should continue PPI qD x 4 weeks with GI follow up in 6 weeks.    Also seen by endocrinology for new diagnosis of diabetes. He was given insulin in house and will be discharged on PO meds (metformin 500 mg daily x1 week then BID, as well as glipizide 5).     He was started on lisinopril and amlodipine for high BP.    He will be seen in the [facility] to establish care with [name] PCP and also for endocrinology follow up.     Condition at Discharge:   Good    Today:     BP 133/87   Pulse 75   Temp 97.9 F (36.6 C) (Oral)   Resp 18   Ht 1.702 m (5\' 7" )   Wt 93 kg (205 lb)   SpO2 95%   BMI 32.11  kg/m   Ranges for the last 24 hours:  Temp:  [97.4 F (36.3 C)-98.6 F (37 C)] 97.9 F (36.6 C)  Heart Rate:  [65-95] 75  Resp Rate:  [16-19] 18  BP: (84-155)/(51-102) 133/87    General: awake, alert, oriented x 3; no acute distress.  Cardiovascular: regular rate and rhythm, no murmurs, rubs or gallops  Lungs: clear to auscultation bilaterally, without wheezing, rhonchi, or rales  Abdomen: soft, non-tender, non-distended; no palpable masses, no hepatosplenomegaly, normoactive bowel sounds, no rebound or guarding  Extremities: no clubbing, cyanosis, or edema    Last set of labs   Recent Labs   Lab 04/12/23  0547   WBC 7.47   Hgb 16.8   Hematocrit 47.4   Platelets 211     Recent Labs   Lab 04/12/23  0547   Sodium 136   Potassium 3.9   Chloride 105   CO2 21   BUN 16.0   Creatinine 0.9   eGFR >60.0   Glucose 183*   Calcium 8.8     Recent Labs   Lab  04/09/23  2154   Bilirubin, Total 1.1   Protein, Total 8.3   Albumin 4.3   ALT 124*   AST (SGOT) 46*               Invalid input(s): "FREET4"  Recent Labs   Lab 04/11/23  0256   Triglycerides 363*     Recent Labs   Lab 04/10/23  0115   hs Troponin-I 17.6     Microbiology Results (last 15 days)       ** No results found for the last 360 hours. **            Micro / Labs / Path pending:     Unresulted Labs       None            Discharge Instructions For Providers     As above.               Discharge Instructions:      Follow-up Information       [facility]. Schedule an appointment as soon as possible for [name] visit .    Specialty: Family Medicine  Contact information:  [address]  [place] [address]  [phone]  Additional information:  From the [place]:   Take [address] south. Take exit [name] to merge onto [address] toward [place].    Turn right onto [address]. Your destination will be on the left.       From the [place]:   Take [address] towards [address]. Merge onto [address]. Take exit [name] to merge onto  [address]/   [facility] toward [address]. Turn right onto [address]. Your destination will be on the left.              [facility]. Schedule an appointment as soon as possible for [name] visit .    Specialty: Gastroenterology  Contact information:  [address]  [address]  [place] [address]  [phone]  Additional information:  Clinic is located at [address] [place] [address] on the [facility] campus. It is across the street from [facility]. From [employer], take exit [address] to [address] - [place].  Take the second right to turn on to [address]. The [facility] will be directly ahead on the left.    Free Valet Parking (Monday through Friday 7:00am-4:30pm) is available at Zone #1 near the Main Entrance. Look for the signs along [address]. Free Patient and Visitor Self-Parking is available in the "B" - [employer] [address] at the [place] end of the campus or just across from the [employer] on the "D" - Coun[address]or Lot.   From the "B" - [address] enter Retail [employer] on the level you have parked on (B1, B1.5 or B3) on and take elevator to the 2nd floor.             Mark [name], CDE Follow up on 04/25/2023.    Why: at 10am.  This is an in person appt.  Contact information:  [facility] Diabetes education  [address]  [place] [address]                           Discharge Diet: Consistent Carbohydrates        Disposition:  Home or Self Care     Discharge Medication List        Taking      amLODIPine 10 MG tablet  Dose: 10 mg  Commonly known as: NORVASC  Take 1 tablet (10 mg) by mouth daily     glipiZIDE XL 5 MG 24 hr tablet  Dose: 5 mg  Commonly known as: GLUCOTROL XL  Take 1 tablet (5 mg) by mouth daily     lisinopril 20 MG tablet  Dose: 20 mg  Commonly known as: ZESTRIL  Take 1 tablet (20 mg) by mouth daily     metFORMIN  500 MG tablet  Dose: 500 mg  Commonly known as: GLUCOPHAGE  Take 1 tablet (500 mg) by mouth 2 (two) times daily with meals For first week, take 1 tablet once [name] day, then after that take twice [name] day     pantoprazole 40 MG tablet  Dose: 40 mg  Commonly known as: PROTONIX  Take 1 tablet (40 mg) by mouth daily            Minutes spent coordinating discharge and reviewing discharge plan: >35 minutes      Signed by: Mark Russel, MD

## 2023-04-12 NOTE — Progress Notes (Signed)
Medicine Shift Note    Braden Scale Score: 22 (04/11/23 2200)  Skin Integrity: Scars  Patient Lines/Drains/Airways Status       Active Lines, Drains and Airways       Name Placement date Placement time Site Days    Peripheral IV 04/09/23 20 G Standard Left Antecubital 04/09/23  2226  Antecubital  2                      Skin  4 eyes in 4 hours pressure injury assessment note:      Completed with:             Bony Prominences: Check appropriate box; if wound is present enter wound assessment in LDA      Occiput:                 [x] WNL  []  Wound present  Face:                     [x] WNL  []  Wound present  Ears:                      [x] WNL  []  Wound present  Spine:                    [x] WNL  []  Wound present  Shoulders:             [x] WNL  []  Wound present  Elbows:                  [x] WNL  []  Wound present  Sacrum/coccyx:     [x] WNL  []  Wound present  Ischial Tuberosity:  [x] WNL  []  Wound present  Trochanter/Hip:      [x] WNL  []  Wound present  Knees:                   [x] WNL  []  Wound present  Ankles:                   [x] WNL  []  Wound present  Heels:                    [x] WNL  []  Wound present  Other pressure areas:  []  Wound location    If wound is present in any of the following from above, please describe findings below:         Device related: []  Device name:         LDA completed if wound present: yes/no    Consult WOCN: no    Other skin related issues, ie tears, rash, etc, document in Integumentary flowsheet  See skin flowsheets    Last BM: 4/24    Pending Orders: labs    Discharge Plan: TBD    POC: patient    Tele: no tele    Activity: independent    PT/OT: n/a    Interpreter Needs: Spanish    Intake & Output for the shift: I/O this shift:  In: 300 [P.O.:300]  Out: -     Shift Note: A&O x4, on RA, hypertensive otherwise VSS. Denies N/V/D, SOB, or pain. NPO status maintained from [place] for EGD planned for 4/25. All meds administered per [facility] and tolerated well. All safety precautions in place and call bell within  reach.

## 2023-04-12 NOTE — Anesthesia Postprocedure Evaluation (Signed)
Anesthesia Post Evaluation    Patient: Mark [name]    Procedure(s):  EGD, BIOPSY    Anesthesia type: general    Last Vitals:   Vitals Value Taken Time   BP 107/61 04/12/23 1340   Temp 36.3 C (97.4 F) 04/12/23 1321   Pulse 73 04/12/23 1340   Resp 17 04/12/23 1340   SpO2 92 % 04/12/23 1340                 Anesthesia Post Evaluation:     Patient Evaluated: bedside  Patient Participation: complete - patient participated  Level of Consciousness: awake  Pain Score: 0  Pain Management: adequate    Airway Patency: patent        Anesthetic complications: No      PONV Status: none    Cardiovascular status: acceptable  Respiratory status: acceptable  Hydration status: acceptable      Signed by: Saintclair Halsted, MD, 04/12/2023 2:42 PM

## 2023-04-12 NOTE — Discharge Instr - AVS First Page (Addendum)
Reason for your Hospital Admission:  You were admitted with abdominal pain. You had an upper endoscopy which showed inflammation of the stomach ("Gastritis"). Biopsies were taken. Please take pantoprazole every day for 4 weeks and follow up with the GI doctors in 6 weeks.    You also were found to have a new diagnosis of diabetes and were seen by endocrinology. You will be starting some new oral medications.      Instructions for after your discharge:  You will need [facility] to get a new primary care doctor.           ==================================================================================================================================================      ENDOCRINOLOGY HOSPITAL DISCHARGE RECOMMENDATIONS   [facility]         Your more recent Hemoglobin A1c:   Hemoglobin A1C (%)   Date Value   04/10/2023 9.5 (H)          Hemoglobin A1C Diagnosis   <5.7% Normal   5.7-6.4% Prediabetes   ?6.5% Diabetes         Check your blood sugar Your target BG numbers     [x]      Fasting/before breakfast      80 - 130 mg/dL       [x]     Before meals     100-150 mg/dL         Mdase el azcar en la sangre Sus valores de referencia de glucosa en la sangre     [x]      En ayunas/antes del desayuno   80-130 mg/dL     [x]     Antes de las comidas   100-150       DIABETES Rx YOUR DOCTOR RECOMMENDED FOR YOU:       Breakfast Lunch Dinner     Non insulin diabetes medications    Metformin 500mg  orally daily 1 week, then increase to 500mg  orally twice daily    Glipizide 5mg  orally daily        Metformin    Gipizide      Metformin       SU MDICO LE HA DADO ESTA Rx (RECETA) DE INSULINA:    Dosis de insulina que debe tomar       Desayuno Almuerzo Cena     No insulina medicina por su diabetes    Metformin 500 mg por [place] oral al da durante 1 semana, luego aumentar a 500 mg por [place] oral dos veces al da    Glipizide 5 mg por [place] oral al da     Metformin    Glipizide      Metformin      LOW GLUCOSE/HYPOGLYCEMIA:  Blood sugar (glucose) below 70 mg/dL is considered low. Please check if you think you have a low blood sugar level. If unable to check, please treat it. Untreated hypoglycemia can be dangerous which is why it is important to know what to do and how to treat immediately.    "15-15 Rule": If you have a low sugar between 55-69 mg/dL, have 15 grams of carbs. Check your glucose levels after 15 minutes. Repeat if you're still below your target range by having another serving. Repeat until you are in target range. Once in range, have a nutritious meal or snack to ensure it does not get too low again and follow up with your primary care provider.     Est atento a los sntomas de bajo nivel de azcar (temblores, debilidad, confusin, aturdimiento  o sudoracin) Si es bajo (<70), tome un pequeo refrigerio como 4 onzas de jugo de [place], 1 taza de [place], 4-5 piezas de duro dulces o 3-4 tabletas de glucosa. Vuelva a verificar el azcar en 15 minutos. Si no mejora, puede repetir segn sea necesario.    These items have about 15 grams of carbs:  4 ounces ( cup) of juice or regular soda.  1 tablespoon of sugar, honey, or syrup.  Hard candies, jellybeans, or gumdrops (see food label for how much to eat).  3-4 glucose tablets (follow instructions).  1 dose of glucose gel (usually 1 tube; follow instructions).    Tips to keep in mind:  It takes time for blood sugar to rise after eating. Give some time for treatment to work. Following the 15-15 rule helps.  You should avoid eating a carb with lots of fiber, such as beans or lentils, or a carb that also has fat, such as chocolate. Fiber and fat slow down how fast you absorb sugar.  Check your blood sugar often when lows are more likely, such as when the weather is hot or when you travel.     How To Treat Low Blood Sugar (Hypoglycemia)  Diabetes  CDC; [email or web address]      How to check your blood  sugars:      Check your blood sugars. Bring this log to your doctor:  DATE BLOOD SUGAR   BEFORE   BREAKFAST INSULIN GIVEN/NOTES BLOOD SUGAR   BEFORE LUNCH  INSULIN GIVEN/NOTES BLOOD SUGAR   BEFORE DINNER INSULIN GIVEN/NOTES BLOOD SUGAR   BEFORE BEDTIME INSULIN GIVEN/NOTES  Diabetic Diet Recommendations:  The American Diabetes Association offers a simple method of meal planning. In essence, it focuses on eating more vegetables. Follow these steps when preparing your plate:    Fill half of your plate with nonstarchy vegetables, such as spinach, carrots and tomatoes.  Fill a quarter of your plate with a protein, such as tuna, lean pork or chicken.  Fill the last quarter with a whole-grain item, such as brown rice, or a starchy vegetable, such as green peas.  Include "good" fats such as nuts or avocados in small amounts.  Add a serving of fruit or dairy and a drink of water or unsweetened tea or coffee.    Most adults with diabetes aim for 45-60 grams of carbs per meal and 15-20 grams per snack.   Please note that each food listed below contains 1 serving (15 grams) of carbohydrate:  Alimentos que contienen carbohidratos:  Starches:  1 slice bread (1 ounce)   1 tortilla (6-inch size)    large bagel (1 ounce)   2 taco shells (5-inch size)    hamburger or hot dog bun ( ounce)    cup ready-to-eat unsweetened cereal    cup cooked cereal   1 cup broth-based soup   4 to 6 small crackers   ? cup pasta or rice (cooked)    cup beans, peas, corn, sweet potatoes, winter squash, or mashed or boiled potatoes (cooked)    large baked potato (3 ounces)     ounce pretzels, potato chips, or tortilla chips   3 cups popcorn (popped)      Sweets and Desserts (<1x/week):  2-inch square cake (unfrosted)   2 small cookies (? ounce)    cup ice cream or frozen yogurt    cup sherbet or sorbet   1 tablespoon syrup, jam, jelly, table sugar, or honey   2 tablespoons light syrup      Milk:  1 cup fat-free or reduced-fat milk   1 cup soy milk   ? cup (6 ounces) nonfat yogurt sweetened with sugar-free sweetener      Fruit:  1 small fresh fruit ( to 1 cup)    cup canned or frozen fruit   2 tablespoons dried fruit (blueberries, cherries, cranberries, mixed fruit, raisins)   17 small grapes (3 ounces)   1 cup melon or berries    cup unsweetened fruit juice      Note:  Please avoid sweets, desserts, sweetened beverages, soda, and juice.  Note that the following foods are examples of foods free of carbohydrates and do not need to be avoided: non-starchy vegetables (for example: Spinach, cucumbers, tomatoes, lettuce, broccoli, cauliflower, green beans, snap peas, zucchini), and protein foods (nuts, seeds, chicken, fish, beef, cheese, eggs).  Please select unsalted, lean, and low fat protein foods.     Snack ideas (select one food option for a protein and combine it with one carbohydrate serving, see examples below):     Proteins and healthy fats:  1 tablespoon of peanut butter, almonds or cashews.  1-2 ounces of chicken salad  1-2 ounces peeled chicken  1-2 ounces low-sodium [place]  2 ounces of tuna  1 boiled egg  4 ounces low-fat cottage cheese  1 string of cheese  1 slice of Swiss cheese  10 untreated almonds or peanuts  1/4 avocado  2 tablespoons of hummus     Carbohydrates:  1 small apple  1 small sweet potato  1 small banana  1/2 cup plain  cooked oatmeal  1 small pear  1/2 cup strawberries  1/2 cup blueberries  3 graham crackers  6 whole wheat crackers  1 slice of whole wheat bread [place]:  1 rebanada de pan (1 onza)  1 tortilla (tamao de 6 pulgadas)   bagel grande (1  onza)  2 tacos (tamao de 5 pulgadas)   hamburguesa o pan de hot dog ( onza)   taza de cereal sin azcar listo para comer   taza de cereal cocido  1 taza de sopa a base de caldo  4 a 6 galletas pequeas  1/3 taza de pasta o arroz (cocido)   taza de frijoles, guisantes, maz, batatas, calabaza de invierno o pur o papas hervidas (cocidas)   papa grande al horno (3 onzas)   onzas de pretzels, papas fritas o tortillas  3 tazas de palomitas de maz (reventadas)    Dulces y Programmer, multimedia (<1x / semana):  Pastel cuadrado de 2 pulgadas (sin escarcha)  2 galletas pequeas    taza de helado o yogurt congelado   taza de sorbete o sorbete  1 cucharada de jarabe, [place], gelatina, azcar de mesa o miel  2 cucharadas de jarabe ligero    [place]:  1 taza de leche descremada o baja en grasa  1 taza de leche de soja  3/4 taza (6 onzas) de yogur sin grasa endulzado con edulcorante sin azcar    Fruta:  1 fruta fresca pequea ( a 1 taza)   taza de fruta enlatada o congelada  2 cucharadas de frutas secas (arndanos, cerezas, arndanos, frutas mixtas, pasas)  17 uvas pequeas (3 onzas)  1 taza de meln o bayas   taza de jugo de fruta sin azcar    Nota: evite los dulces, postres, bebidas azucaradas, refrescos y [place]. Tenga en cuenta que los siguientes alimentos son ejemplos de alimentos libres de carbohidratos y no deben evitarse: vegetales sin almidn (por ejemplo: espinacas, pepinos, tomates, lechuga, brcoli, coliflor, judas verdes, guisantes, calabacn) y alimentos con protenas (nueces, semillas, pollo, pescado, carne de res, [place], [place]). Seleccione alimentos con protenas sin sal, magras y bajas en grasas.     Note:  Please avoid sweets, desserts, sweetened beverages, soda, and juice.  Note that the following foods are examples of foods free of carbohydrates and do not need to be avoided: non-starchy vegetables (for example: Spinach, cucumbers, tomatoes, lettuce, broccoli, cauliflower, green beans, snap peas,  zucchini), and protein foods (nuts, seeds, chicken, fish, beef, cheese, eggs).  Please select unsalted, lean, and low fat protein foods.

## 2023-04-12 NOTE — Progress Notes (Signed)
ENDOCRINE PROGRESS NOTE      Date Time: 04/12/23 3:07 PM  Patient Name: Mark [name]  Attending Physician: Henry Russel, MD  Consulting Attending Physician: Henry Russel, MD   Admit Date:  04/09/2023  Payor: /   Orders Placed This Encounter   Procedures    Diet NPO time specified Except for: SIPS OF WATER ONLY WITH MEDS; - Surgery/Procedure         Assessment/Plan:   Mark [name] is [name] 47 y.o. male with past medical history of obesity, hepatic steatosis who presented to the hospital on 04/09/2023 with gastric pain and hyperglycemia, found to have gastritis and undiagnosed diabetes (hgba1c 9/5%).  Endocrinology consulted for management of newly diagnosed diabetes.     EGD today showing mild antral gastritis.     Uncontrolled type 2 diabetes with hyperglycemia:   Obesity   Lab Results   Component Value Date    HGBA1C 9.5 (H) 04/10/2023     Body mass index is 32.11 kg/m.  Hyperglycemia exacerbated by unknown diabetes status  Reduce Lantus 10 units qAM; Reduce by 50% if patient is NPO or BG is less than 120 mg/dl  Continue Lispro low dose SSI qAC/qHS for additional hyperglycemic coverage  Start metformin 500mg  orally daily  Will continue to monitor for hypoglycemia and hyperglycemia and adjust insulin regimen as needed  Insulin resistance likely due to overweight status    BG goal in hospital: 140 to 180 mg/dl  Discharge recommendations as below  I have scheduled outpatient diabetes education          Endocrine Recommendations         Home regimen prior to hospital presentation    None             Please discharge on this   regimen       Metformin 500mg  orally daily x 1 week, then increase to 500mg  orally BID  +  Glipizide 5mg  orally daily  +   Glucomter/test strips/lancets         Follow up provider    [facility]  +  [place] diabetes education 04/25/23 at 10am            I have reviewed the following: blood glucose levels, insulin requirements   I have ordered the following:  insulin changes as above    Case discussed with Dr. Carlynn Purl, ACNP-BC  [facility]  Department of Medicine, Diabetes & Endocrine  Spectra [phone] chat   If unavailable, please page [phone] endocrine or Epic chat group IP Endocrinology      Subjective:    Pt seen after returning from EGD.  Pt feeling well post procedure with no active issues or complaints.  BG reviewed and noted to have continued hyperglycemia. Insulin adjusted as listed above with the addition of metformin.  Plan for [place] today.     Reviewed diabetes discharge plan in detail with all questions answered.    Spanish interpretor used for communication.  Review of Systems:   All systems reviewed and negative, except per subjective/HPI.    Physical Exam:   Temp:  [97.4 F (36.3 C)-98.6 F (37 C)] 97.9 F (36.6 C)  Heart Rate:  [65-95] 75  Resp Rate:  [16-19] 18  BP: (84-155)/(51-102) 133/87  Wt Readings from Last 1 Encounters:   04/09/23 93 kg (205 lb)      Body mass index is 32.11 kg/m.  Intake/Output Summary (Last 24 hours) at 04/12/2023 1507  Last data filed at 04/12/2023 1321  Gross per 24 hour   Intake 700 ml   Output --   Net 700 ml       Physical Exam:   General: well-developed, alert, NAD  Lungs: normal rate and effort  Cardiovascular:  regular rate/regular rhythm   Abdominal: soft, NT/ND   Neuromuscular exam: alert and oriented x 3, no gross motor deficits, speech fluent   Skin: no rashes or lesions noted on exposed surfaces    Meds:     Scheduled Meds:  Current Facility-Administered Medications   Medication Dose Route Frequency    amLODIPine  10 mg Oral Daily    [START ON 04/13/2023] insulin glargine  10 Units Subcutaneous Daily    Or    [START ON 04/13/2023] insulin glargine  5 Units Subcutaneous Daily    insulin lispro  1-3 Units Subcutaneous QHS    insulin lispro  1-5 Units Subcutaneous TID AC    lisinopril  20 mg Oral Daily    metFORMIN  500 mg Oral Daily    pantoprazole  40 mg Intravenous BID     polyethylene glycol  17 g Oral Daily    senna-docusate  1 tablet Oral BID     Continuous Infusions:   lactated ringers Stopped (04/12/23 1356)     PRN Meds:.acetaminophen, alum & mag hydroxide-simethicone, benzocaine-menthol, benzonatate, carboxymethylcellulose sodium, dextrose **OR** dextrose **OR** dextrose **OR** glucagon (rDNA), hydrALAZINE, magnesium sulfate, melatonin, naloxone, potassium & sodium phosphates, potassium chloride **AND** potassium chloride, saline    Labs:     Lab Results   Component Value Date    HGBA1C 9.5 (H) 04/10/2023       Whole Blood Glucose POCT   Date/Time Value Ref Range Status   04/12/2023 1134 151 (H) 70 - 100 mg/dL Final   54/08/8118 1478 188 (H) 70 - 100 mg/dL Final     Comment:     RN Notified   04/11/2023 2256 226 (H) 70 - 100 mg/dL Final   29/56/2130 8657 318 (H) 70 - 100 mg/dL Final   84/69/6295 2841 241 (H) 70 - 100 mg/dL Final       Recent Labs     04/12/23  0547 04/11/23  0256   WBC 7.47 7.93   Hgb 16.8 16.5   Hematocrit 47.4 45.4   Platelets 211 210   MCV 86.5 84.9       Recent Labs     04/12/23  0547 04/11/23  0256   Sodium 136 134*   Potassium 3.9 3.4*   Chloride 105 99   CO2 21 24   BUN 16.0 14.0   Creatinine 0.9 0.9   Glucose 183* 232*   Calcium 8.8 8.6       Recent Labs     04/09/23  2154   AST (SGOT) 46*   ALT 124*   Alkaline Phosphatase 114   Protein, Total 8.3   Albumin 4.3       No results for input(s): "PTT", "PT", "INR" in the last 72 hours.      Signed by: Arman Bogus, NP

## 2023-04-12 NOTE — Plan of Care (Signed)
Problem: Pain interferes with ability to perform ADL  Goal: Pain at adequate level as identified by patient  Outcome: Progressing  Flowsheets (Taken 04/12/2023 0005)  Pain at adequate level as identified by patient:   Identify patient comfort function goal   Assess for risk of opioid induced respiratory depression, including snoring/sleep apnea. Alert healthcare team of risk factors identified.   Assess pain on admission, during daily assessment and/or before any "as needed" intervention(s)   Reassess pain within 30-60 minutes of any procedure/intervention, per Pain Assessment, Intervention, Reassessment (AIR) Cycle   Evaluate if patient comfort function goal is met   Evaluate patient's satisfaction with pain management progress   Offer non-pharmacological pain management interventions   Include patient/patient care companion in decisions related to pain management as needed     Problem: Side Effects from Pain Analgesia  Goal: Patient will experience minimal side effects of analgesic therapy  Outcome: Progressing  Flowsheets (Taken 04/12/2023 0005)  Patient will experience minimal side effects of analgesic therapy:   Monitor/assess patient's respiratory status (RR depth, effort, breath sounds)   Assess for changes in cognitive function   Prevent/manage side effects per LIP orders (i.e. nausea, vomiting, pruritus, constipation, urinary retention, etc.)   Evaluate for opioid-induced sedation with appropriate assessment tool (i.e. POSS)     Problem: Hemodynamic Status: Cardiac  Goal: Stable vital signs and fluid balance  Outcome: Progressing  Flowsheets (Taken 04/12/2023 0005)  Stable vital signs and fluid balance:   Assess signs and symptoms associated with cardiac rhythm changes   Monitor lab values     Problem: Inadequate Tissue Perfusion  Goal: Adequate tissue perfusion will be maintained  Outcome: Progressing  Flowsheets (Taken 04/12/2023 0005)  Adequate tissue perfusion will be maintained:   Monitor/assess lab  values and report abnormal values   Monitor/assess neurovascular status (pulses, capillary refill, pain, paresthesia, paralysis, presence of edema)   Monitor/assess for signs of VTE (edema of calf/thigh redness, pain)   Monitor for signs and symptoms of a pulmonary embolism (dyspnea, tachypnea, tachycardia, confusion)   Encourage/assist patient as needed to turn, cough, and perform deep breathing every 2 hours     Problem: Ineffective Gas Exchange  Goal: Effective breathing pattern  Outcome: Progressing  Flowsheets (Taken 04/12/2023 0005)  Effective breathing pattern: Maintain CO2 level per LIP order     Problem: Altered GI Function  Goal: Fluid and electrolyte balance are achieved/maintained  Outcome: Progressing  Flowsheets (Taken 04/12/2023 0005)  Fluid and electrolyte balance are achieved/maintained:   Monitor/assess lab values and report abnormal values   Assess and reassess fluid and electrolyte status   Observe for cardiac arrhythmias   Monitor for muscle weakness  Goal: Elimination patterns are normal or improving  Outcome: Progressing  Flowsheets (Taken 04/12/2023 0005)  Elimination patterns are normal or improving: Assess for and discuss C. diff screening with LIP

## 2023-04-12 NOTE — Plan of Care (Signed)
Problem: Pain interferes with ability to perform ADL  Goal: Pain at adequate level as identified by patient  Outcome: Progressing  Flowsheets (Taken 04/12/2023 1125)  Pain at adequate level as identified by patient:   Reassess pain within 30-60 minutes of any procedure/intervention, per Pain Assessment, Intervention, Reassessment (AIR) Cycle   Assess pain on admission, during daily assessment and/or before any "as needed" intervention(s)   Assess for risk of opioid induced respiratory depression, including snoring/sleep apnea. Alert healthcare team of risk factors identified.   Identify patient comfort function goal   Include patient/patient care companion in decisions related to pain management as needed   Evaluate if patient comfort function goal is met   Offer non-pharmacological pain management interventions   Evaluate patient's satisfaction with pain management progress     Problem: Side Effects from Pain Analgesia  Goal: Patient will experience minimal side effects of analgesic therapy  Outcome: Progressing  Flowsheets (Taken 04/12/2023 1125)  Patient will experience minimal side effects of analgesic therapy:   Monitor/assess patient's respiratory status (RR depth, effort, breath sounds)   Prevent/manage side effects per LIP orders (i.e. nausea, vomiting, pruritus, constipation, urinary retention, etc.)   Assess for changes in cognitive function     Problem: Hemodynamic Status: Cardiac  Goal: Stable vital signs and fluid balance  Outcome: Progressing  Flowsheets (Taken 04/12/2023 1125)  Stable vital signs and fluid balance:   Assess signs and symptoms associated with cardiac rhythm changes   Monitor lab values     Problem: Inadequate Tissue Perfusion  Goal: Adequate tissue perfusion will be maintained  Outcome: Progressing  Flowsheets (Taken 04/12/2023 1125)  Adequate tissue perfusion will be maintained:   Monitor/assess neurovascular status (pulses, capillary refill, pain, paresthesia, paralysis, presence of  edema)   Monitor/assess lab values and report abnormal values   Monitor/assess for signs of VTE (edema of calf/thigh redness, pain)   Monitor for signs and symptoms of a pulmonary embolism (dyspnea, tachypnea, tachycardia, confusion)   Encourage/assist patient as needed to turn, cough, and perform deep breathing every 2 hours     Problem: Ineffective Gas Exchange  Goal: Effective breathing pattern  Outcome: Progressing  Flowsheets (Taken 04/12/2023 1125)  Effective breathing pattern: Teach/reinforce use of ordered respiratory interventions (ie. CPAP, BiPAP, Incentive Spirometer, Acapella)     Problem: Altered GI Function  Goal: Fluid and electrolyte balance are achieved/maintained  Outcome: Progressing  Flowsheets (Taken 04/12/2023 1125)  Fluid and electrolyte balance are achieved/maintained:   Monitor/assess lab values and report abnormal values   Assess and reassess fluid and electrolyte status   Observe for cardiac arrhythmias   Monitor for muscle weakness  Goal: Elimination patterns are normal or improving  Outcome: Progressing  Flowsheets (Taken 04/12/2023 1125)  Elimination patterns are normal or improving: Assess for and discuss C. diff screening with LIP

## 2023-04-12 NOTE — Progress Notes (Signed)
04/12/23 1529   Discharge Disposition   Patient preference/choice provided? Yes   Physical Discharge Disposition Home   Mode of Transportation Car  (pt's spouse will able to provide ride upon discharge)   Patient/Family/POA notified of transfer plan Yes  (patient at bedside made aware)   Patient agreeable to discharge plan/expected d/c date? Yes   Bedside nurse notified of transport plan? Yes   CM Interventions   Follow up appointment scheduled?   (see AVS)   Multidisciplinary rounds/family meeting before d/c? Yes   Medicare Checklist   Is this a Medicare patient? No     Francee Piccolo  RN Case Manager I

## 2023-04-13 ENCOUNTER — Encounter: Payer: Self-pay | Admitting: Internal Medicine

## 2023-04-16 NOTE — Provider Clarification Note (Signed)
Patient Name: Mark [name], Mark [name]  Account #: [id number]   MR #: [id number]  Discharge Date: 04/12/2023    Dear Dr. Sula Rumple    Your patient has an abnormal lab value: K 3.3 on 4/22.  Please clarify if there is an additional diagnosis and/or the clinical significance related to this value.    History/Risk Factors:    47 y.o. male presented with severe abd pain; admitted with gastritis w/out bleeding, nausea but no vomiting, found to have new onset DM2    Clinical Indicators:   Labs:  4/22: K 3.3, Na 136, Cr 1.0  4/24: K 3.4, Na 134, Cr 0.9    4/25 [place] Summary: severe abd pain, epigastric 2/2 gastritis, HTN urgency, hepatic steatosis, DM2 new diagnosis, obesity, BMI 32    Treatment:      Monitor BMP, KlorCon 40 meq  x 1, IV KCl x 1    Question: Please document an additional diagnosis and/or clinical significance related to the above lab result/information:    Hypokalemia  Other, please specify      Thank you,    Misty Stanley A. Mathis Fare BSN, RN, CCDS    Clinical Documentation Integrity    [phone]    Email: [email or web address]    Date:  04/13/2023        PROVIDER RESPONSE (Choose from list above or add free text): Hypokalemia

## 2023-04-17 LAB — LAB USE ONLY - HISTORICAL SURGICAL PATHOLOGY

## 2023-04-24 ENCOUNTER — Ambulatory Visit (INDEPENDENT_AMBULATORY_CARE_PROVIDER_SITE_OTHER): Payer: Self-pay | Admitting: Emergency Medicine

## 2023-04-25 ENCOUNTER — Ambulatory Visit (INDEPENDENT_AMBULATORY_CARE_PROVIDER_SITE_OTHER): Payer: Self-pay | Admitting: Registered Nurse

## 2023-05-16 ENCOUNTER — Ambulatory Visit (INDEPENDENT_AMBULATORY_CARE_PROVIDER_SITE_OTHER): Payer: Self-pay | Admitting: Internal Medicine

## 2023-05-16 ENCOUNTER — Ambulatory Visit (INDEPENDENT_AMBULATORY_CARE_PROVIDER_SITE_OTHER): Payer: Self-pay
